# Patient Record
Sex: Female | Born: 1980 | Race: Black or African American | Hispanic: No | Marital: Single | Smoking: Current every day smoker
Health system: Southern US, Community
[De-identification: ages and names within clinical notes are randomized; demographics above are authoritative.]

## PROBLEM LIST (undated history)

## (undated) DIAGNOSIS — M1990 Unspecified osteoarthritis, unspecified site: Secondary | ICD-10-CM

## (undated) DIAGNOSIS — B019 Varicella without complication: Secondary | ICD-10-CM

## (undated) DIAGNOSIS — IMO0001 Reserved for inherently not codable concepts without codable children: Secondary | ICD-10-CM

## (undated) HISTORY — DX: Unspecified osteoarthritis, unspecified site: M19.90

## (undated) HISTORY — DX: Varicella without complication: B01.9

## (undated) HISTORY — PX: ROBOTIC ASSISTED LAPAROSCOPIC VAGINAL HYSTERECTOMY WITH FIBROID REMOVAL: SHX5387

---

## 2007-05-11 LAB — HM PAP SMEAR

## 2012-06-01 ENCOUNTER — Encounter: Payer: Self-pay | Admitting: Family Medicine

## 2012-06-01 ENCOUNTER — Ambulatory Visit (INDEPENDENT_AMBULATORY_CARE_PROVIDER_SITE_OTHER): Payer: BC Managed Care – PPO | Admitting: Family Medicine

## 2012-06-01 VITALS — BP 110/70 | HR 105 | Temp 98.5°F | Ht 65.0 in | Wt 180.0 lb

## 2012-06-01 DIAGNOSIS — G5603 Carpal tunnel syndrome, bilateral upper limbs: Secondary | ICD-10-CM

## 2012-06-01 DIAGNOSIS — R209 Unspecified disturbances of skin sensation: Secondary | ICD-10-CM

## 2012-06-01 DIAGNOSIS — Z13220 Encounter for screening for lipoid disorders: Secondary | ICD-10-CM

## 2012-06-01 DIAGNOSIS — Z0000 Encounter for general adult medical examination without abnormal findings: Secondary | ICD-10-CM

## 2012-06-01 DIAGNOSIS — Z131 Encounter for screening for diabetes mellitus: Secondary | ICD-10-CM

## 2012-06-01 DIAGNOSIS — R202 Paresthesia of skin: Secondary | ICD-10-CM

## 2012-06-01 DIAGNOSIS — G5600 Carpal tunnel syndrome, unspecified upper limb: Secondary | ICD-10-CM

## 2012-06-01 LAB — LIPID PANEL
HDL: 59.1 mg/dL (ref >39.00)
LDL Cholesterol: 121 mg/dL — ABNORMAL HIGH (ref 0–99)
Total CHOL/HDL Ratio: 3
Triglycerides: 47 mg/dL (ref 0.0–149.0)
VLDL: 9.4 mg/dL (ref 0.0–40.0)

## 2012-06-01 LAB — HEMOGLOBIN A1C: Hgb A1c MFr Bld: 5.5 % (ref 4.6–6.5)

## 2012-06-01 NOTE — Progress Notes (Signed)
Chief Complaint  Patient presents with  . Establish Care    HPI: Abigail Cherry is here to establish care. Recently moved back to area form [place].  Has not had pap smear in 5 years, female partner.  Concerns today: Tingling sensation in bilat hands: -technician for quadriceps, does a lot of work with hands -thumb and first finger, tingling -worse at night -no weakness or numbness  Other Providers: none  Flu vaccine: -tdap in 2010 when enrolling for college -refused flu vaccine  ROS: See pertinent positives and negatives per HPI.  Past Medical History  Diagnosis Date  . Chicken pox     Family History  Problem Relation Age of Onset  . Alcohol abuse      parent  . Arthritis      parent  . Hypertension      parent  . Mental illness      parent  . Mental illness Other   . Diabetes      parent  . Diabetes Other   . Hypertension Father   . Diabetes Father   . Diabetes Sister     History   Social History  . Marital Status: Single    Spouse Name: N/A    Number of Children: N/A  . Years of Education: N/A   Social History Main Topics  . Smoking status: Current Every Day Smoker -- 0.5 packs/day    Types: Cigarettes  . Smokeless tobacco: None  . Alcohol Use: Yes     4 beers per week  . Drug Use: No  . Sexually Active: Yes     female partner   Other Topics Concern  . None   Social History Narrative  . None    No current outpatient prescriptions on file.  EXAM:  Filed Vitals:   06/01/12 1107  BP: 110/70  Pulse: 105  Temp: 98.5 F (36.9 C)    Body mass index is 29.95 kg/(m^2).  GENERAL: vitals reviewed and listed above, alert, oriented, appears well hydrated and in no acute distress  HEENT: atraumatic, conjunttiva clear, no obvious abnormalities on inspection of external nose and ears  NECK: no obvious masses on inspection  LUNGS: clear to auscultation bilaterally, no wheezes, rales or rhonchi, good air movement  CV: HRRR, no peripheral  edema  MS: moves all extremities without noticeable abnormality  PSYCH: pleasant and cooperative, no obvious depression or anxiety  ASSESSMENT AND PLAN:  Discussed the following assessment and plan:  1. Carpal tunnel syndrome on both sides    2. Encounter for preventive health examination  Lipid Panel, Hemoglobin A1c  3. Paresthesias  Vitamin D, 25-hydroxy, Vitamin B12  4. Screening for diabetes mellitus    5. Screening for hypercholesterolemia     -We reviewed the PMH, PSH, FH, SH, Meds and Allergies. -We addressed current concerns per orders and patient instructions. -USPSTF preventive care recommendations discussed - advised on safe alcohol use, safe sexual practices, diet and exercise, cervical cancer screening, vaccines, diabetes and hyperlipidemia screening -We have asked for records for pertinent exams, studies, vaccines and notes from previous providers. -We have advised patient to follow up per instructions below. -Influenza vaccine refused  -Patient advised to return or notify a doctor immediately if symptoms worsen or persist or new concerns arise.  Patient Instructions  --We have ordered labs or studies at this visit. It usually takes 1-2 weeks for results and processing. We will contact you with instructions IF your results are abnormal. Normal results will be  released to your [facility] in 1-2 weeks. If you have not heard from [place] or can not find your results in [facility] in 2 weeks please contact our office.  -buy cockup wrist brace at drugstore and wear nightly and as tolerated during the day  -do stretching exercises demonstrated multiple times throughout the day  -follow up 2 months for pap smear and follow up (schedule 30 minute appointment)  Thank you for enrolling in MyChart. Please follow the instructions below to securely access your online medical record. MyChart allows you to send messages to your doctor, view your test results, renew your prescriptions, schedule  appointments, and more.  How Do I Sign Up? 1. In your Internet browser, go to [email or web address] 2. Click on the New  User? link in the Sign In box.  3. Enter your MyChart Access Code exactly as it appears below. You will not need to use this code after you have completed the sign-up process. If you do not sign up before the expiration date, you must request a new code. MyChart Access Code: [email or web address] Expires: 07/01/2012 11:35 AM  4. Enter the last four digits of your Social Security Number (xxxx) and Date of Birth (mm/dd/yyyy) as indicated and click Next. You will be taken to the next sign-up page. 5. Create a MyChart ID. This will be your MyChart login ID and cannot be changed, so think of one that is secure and easy to remember. 6. Create a MyChart password. You can change your password at any time. 7. Enter your Password Reset Question and Answer and click Next. This can be used at a later time if you forget your password.  8. Select your communication preference, and if applicable enter your e-mail address. You will receive e-mail notification when new information is available in MyChart by choosing to receive e-mail notifications and filling in your e-mail. 9. Click Sign In. You can now view your medical record.   Additional Information If you have questions, you can email [email or web address] or call [phone] to talk to our MyChart staff. Remember, MyChart is NOT to be used for urgent needs. For medical emergencies, dial 911.       Kriste Basque R.

## 2012-06-01 NOTE — Patient Instructions (Signed)
--  We have ordered labs or studies at this visit. It usually takes 1-2 weeks for results and processing. We will contact you with instructions IF your results are abnormal. Normal results will be released to your [facility] in 1-2 weeks. If you have not heard from [place] or can not find your results in [facility] in 2 weeks please contact our office.  -buy cockup wrist brace at drugstore and wear nightly and as tolerated during the day  -do stretching exercises demonstrated multiple times throughout the day  -follow up 2 months for pap smear and follow up (schedule 30 minute appointment)  Thank you for enrolling in MyChart. Please follow the instructions below to securely access your online medical record. MyChart allows you to send messages to your doctor, view your test results, renew your prescriptions, schedule appointments, and more.  How Do I Sign Up? 1. In your Internet browser, go to [email or web address] 2. Click on the New  User? link in the Sign In box.  3. Enter your MyChart Access Code exactly as it appears below. You will not need to use this code after you have completed the sign-up process. If you do not sign up before the expiration date, you must request a new code. MyChart Access Code: [email or web address] Expires: 07/01/2012 11:35 AM  4. Enter the last four digits of your Social Security Number (xxxx) and Date of Birth (mm/dd/yyyy) as indicated and click Next. You will be taken to the next sign-up page. 5. Create a MyChart ID. This will be your MyChart login ID and cannot be changed, so think of one that is secure and easy to remember. 6. Create a MyChart password. You can change your password at any time. 7. Enter your Password Reset Question and Answer and click Next. This can be used at a later time if you forget your password.  8. Select your communication preference, and if applicable enter your e-mail address. You will receive e-mail notification when new information  is available in MyChart by choosing to receive e-mail notifications and filling in your e-mail. 9. Click Sign In. You can now view your medical record.   Additional Information If you have questions, you can email [email or web address]  [email or web address] or call [phone] to talk to our MyChart staff. Remember, MyChart is NOT to be used for urgent needs. For medical emergencies, dial 911.

## 2012-06-03 ENCOUNTER — Telehealth: Payer: Self-pay | Admitting: Family Medicine

## 2012-06-03 NOTE — Telephone Encounter (Signed)
Please let her know, We saw she hasn't singed up for [facility], so wanted to let her know about labs.  Cholesterol a little high - recommend heathy diet and regular exercise. Vitamin D low. Advise 2000 IU Vitamin D3 OTC daily

## 2012-06-03 NOTE — Telephone Encounter (Signed)
Left a message for return call.  

## 2012-06-06 NOTE — Telephone Encounter (Signed)
Called and spoke with pt and pt is aware.  

## 2013-03-08 ENCOUNTER — Emergency Department (HOSPITAL_COMMUNITY)
Admission: EM | Admit: 2013-03-08 | Discharge: 2013-03-08 | Disposition: A | Discharge status: Home / Self Care | Payer: BC Managed Care – PPO | Source: Home / Self Care

## 2013-03-08 ENCOUNTER — Encounter (HOSPITAL_COMMUNITY): Payer: Self-pay | Admitting: Emergency Medicine

## 2013-03-08 DIAGNOSIS — J069 Acute upper respiratory infection, unspecified: Secondary | ICD-10-CM

## 2013-03-08 MED ORDER — FEXOFENADINE HCL 180 MG PO TABS: 180.0000 mg | ORAL_TABLET | Freq: Every day | ORAL | Status: DC

## 2013-03-08 MED ORDER — AZELASTINE HCL 0.15 % NA SOLN: 2.0000 | Freq: Two times a day (BID) | NASAL | Status: DC

## 2013-03-08 NOTE — ED Provider Notes (Signed)
  CSN: [id number]     Arrival date & time 03/08/13  1633 History     None    Chief Complaint  Patient presents with  . Sore Throat   (Consider location/radiation/quality/duration/timing/severity/associated sxs/prior Treatment) Patient is a 32 y.o. female presenting with pharyngitis. The history is provided by the patient.  Sore Throat This is a new problem. The current episode started more than 1 week ago. The problem has not changed since onset.Associated symptoms comments: Sx improve during day, worse in am.. The symptoms are aggravated by swallowing.    Past Medical History  Diagnosis Date  . Chicken pox    History reviewed. No pertinent past surgical history. Family History  Problem Relation Age of Onset  . Alcohol abuse      parent  . Arthritis      parent  . Hypertension      parent  . Mental illness      parent  . Mental illness Other   . Diabetes      parent  . Diabetes Other   . Hypertension Father   . Diabetes Father   . Diabetes Sister    History  Substance Use Topics  . Smoking status: Current Every Day Smoker -- 0.50 packs/day    Types: Cigarettes  . Smokeless tobacco: Not on file  . Alcohol Use: Yes     Comment: 4 beers per week   OB History   Grav Para Term Preterm Abortions TAB SAB Ect Mult Living                 Review of Systems  Constitutional: Negative.  Negative for fever.  HENT: Positive for congestion, sore throat, rhinorrhea and postnasal drip.     Allergies  Review of patient's allergies indicates no known allergies.  Home Medications   Current Outpatient Rx  Name  Route  Sig  Dispense  Refill  . OVER THE COUNTER MEDICATION      Cough drops, throat spray, tea         . Azelastine HCl 0.15 % SOLN   Nasal   Place 2 sprays into the nose 2 (two) times daily.   30 mL   1   . fexofenadine (ALLEGRA) 180 MG tablet   Oral   Take 1 tablet (180 mg total) by mouth daily.   30 tablet   1    BP 125/81  Pulse 90  Temp(Src)  98.2 F (36.8 C) (Oral)  Resp 18  SpO2 100%  LMP 02/16/2013 Physical Exam  Nursing note and vitals reviewed. Constitutional: She appears well-developed and well-nourished.  HENT:  Head: Normocephalic.  Right Ear: External ear normal.  Left Ear: External ear normal.  Nose: Mucosal edema and rhinorrhea present.  Mouth/Throat: Oropharynx is clear and moist.  Eyes: Pupils are equal, round, and reactive to light.  Neck: Normal range of motion. Neck supple.  Cardiovascular: Normal rate and regular rhythm.   Pulmonary/Chest: Effort normal and breath sounds normal.  Lymphadenopathy:    She has no cervical adenopathy.    ED Course   Procedures (including critical care time)  Labs Reviewed - No data to display No results found. 1. URI (upper respiratory infection)     MDM    Linna Hoff, MD 03/08/13 [phone]

## 2013-03-08 NOTE — ED Notes (Signed)
Reports intermittent sore throat, right ear pain.  Minimal cough.  Denies fever

## 2013-08-07 ENCOUNTER — Ambulatory Visit (INDEPENDENT_AMBULATORY_CARE_PROVIDER_SITE_OTHER): Payer: BC Managed Care – PPO | Admitting: Family Medicine

## 2013-08-07 ENCOUNTER — Encounter: Payer: Self-pay | Admitting: Family Medicine

## 2013-08-07 VITALS — BP 102/70 | Temp 98.6°F | Wt 199.0 lb

## 2013-08-07 DIAGNOSIS — G43909 Migraine, unspecified, not intractable, without status migrainosus: Secondary | ICD-10-CM

## 2013-08-07 DIAGNOSIS — Z0000 Encounter for general adult medical examination without abnormal findings: Secondary | ICD-10-CM

## 2013-08-07 DIAGNOSIS — Z299 Encounter for prophylactic measures, unspecified: Secondary | ICD-10-CM

## 2013-08-07 DIAGNOSIS — J309 Allergic rhinitis, unspecified: Secondary | ICD-10-CM

## 2013-08-07 DIAGNOSIS — E785 Hyperlipidemia, unspecified: Secondary | ICD-10-CM

## 2013-08-07 DIAGNOSIS — E559 Vitamin D deficiency, unspecified: Secondary | ICD-10-CM

## 2013-08-07 LAB — LIPID PANEL
HDL: 44.8 mg/dL (ref >39.00)
LDL Cholesterol: 104 mg/dL — ABNORMAL HIGH (ref 0–99)
Total CHOL/HDL Ratio: 4
VLDL: 12.4 mg/dL (ref 0.0–40.0)

## 2013-08-07 MED ORDER — FLUTICASONE PROPIONATE 50 MCG/ACT NA SUSP: 2.0000 | Freq: Every day | NASAL | Status: DC

## 2013-08-07 MED ORDER — SUMATRIPTAN SUCCINATE 50 MG PO TABS: ORAL_TABLET | ORAL | Status: DC

## 2013-08-07 NOTE — Patient Instructions (Signed)
-  We have ordered labs or studies at this visit. It can take up to 1-2 weeks for results and processing. We will contact you with instructions IF your results are abnormal. Normal results will be released to your [facility]. If you have not heard from [place] or can not find your results in [facility] in 2 weeks please contact our office.  -PLEASE SIGN UP FOR MYCHART TODAY   We recommend the following healthy lifestyle measures: - eat a healthy diet consisting of lots of vegetables, fruits, beans, nuts, seeds, healthy meats such as white chicken and fish and whole grains.  - avoid fried foods, fast food, processed foods, sodas, red meet and other fattening foods.  - get a least 150 minutes of aerobic exercise per week.   -As we discussed, we have prescribed a new medication for you at this appointment. We discussed the common and serious potential adverse effects of this medication and you can review these and more with the pharmacist when you pick up your medication.  Please follow the instructions for use carefully and notify us immediately if you have any problems taking this medication.  -see eye doctor  -1000 IU of vit D3 daily  -flonase 2 sprays daily for one month then 1 spray daily  Follow up in: 1-2 months for pap smear and headaches

## 2013-08-07 NOTE — Progress Notes (Signed)
Chief Complaint  Patient presents with  . Annual Exam    HPI:  Here for CPE:  -Concerns today:   Headaches: -for some time -occurs a few times per month, less frequent recent -on sided, usually on the R, last for several hours  -sleep and ibuprofen help -she thinks trigger may be eye strain as occur after tedious eye work -also has allergie issues, sneezing, nasal congestion -accompanied by sensitivity to light -has a hx headaches a few times per year prior -denies: fevers, vision changes, malaise, vomiting, weakness, numbness  -Diet: variety of foods, balance and well rounded, larger portion sizes  -Taking vit d, calcium: no  -Exercise: no regular exercise  -Diabetes and Dyslipidemia Screening: done  -Hx of HTN: no  -Vaccines: UTD  -pap history: reports thinks last pap was in 2010, normal, all normal paps  -FDLMP: 08/04/13, does not want to do pap today  -wants STI testing: no  -FH breast, colon or ovarian ca: see FH  -Alcohol, Tobacco, drug use: see social history  Review of Systems - negative except where noted above  Past Medical History  Diagnosis Date  . Chicken pox     No past surgical history on file.  Family History  Problem Relation Age of Onset  . Alcohol abuse      parent  . Arthritis      parent  . Hypertension      parent  . Mental illness      parent  . Mental illness Other   . Diabetes      parent  . Diabetes Other   . Hypertension Father   . Diabetes Father   . Diabetes Sister     History   Social History  . Marital Status: Single    Spouse Name: N/A    Number of Children: N/A  . Years of Education: N/A   Social History Main Topics  . Smoking status: Current Every Day Smoker -- 0.50 packs/day    Types: Cigarettes  . Smokeless tobacco: None  . Alcohol Use: Yes     Comment: 4 beers per week  . Drug Use: No  . Sexual Activity: Yes     Comment: female partner   Other Topics Concern  . None   Social History  Narrative  . None    Current outpatient prescriptions:Azelastine HCl 0.15 % SOLN, Place 2 sprays into the nose 2 (two) times daily., Disp: 30 mL, Rfl: 1;  fexofenadine (ALLEGRA) 180 MG tablet, Take 1 tablet (180 mg total) by mouth daily., Disp: 30 tablet, Rfl: 1;  fluticasone (FLONASE) 50 MCG/ACT nasal spray, Place 2 sprays into both nostrils daily., Disp: 16 g, Rfl: 6;  OVER THE COUNTER MEDICATION, Cough drops, throat spray, tea, Disp: , Rfl:  SUMAtriptan (IMITREX) 50 MG tablet, One tablet at onset of headache, may repeat in 2 hours. No more then 2 tablets in 24 hours., Disp: 10 tablet, Rfl: 0  EXAM:  Filed Vitals:   08/07/13 0930  BP: 102/70  Temp: 98.6 F (37 C)    GENERAL: vitals reviewed and listed below, alert, oriented, appears well hydrated and in no acute distress  HEENT: head atraumatic, PERRLA, normal appearance of eyes, ears, nose and mouth. moist mucus membranes except boggy turbinates and clear rhinorrhea with PND  NECK: supple, no masses or lymphadenopathy  LUNGS: clear to auscultation bilaterally, no rales, rhonchi or wheeze  CV: HRRR, no peripheral edema or cyanosis, normal pedal pulses  BREAST: deferred  GU: deferred  RECTAL: refused  SKIN: no rash or abnormal lesions  MS: normal gait, moves all extremities normally  NEURO: CN II-XII grossly intact, normal muscle strength and sensation to light touch on extremities, finger to nose normal  PSYCH: normal affect, pleasant and cooperative  ASSESSMENT AND PLAN:  Discussed the following assessment and plan:  Migraine - Plan: SUMAtriptan (IMITREX) 50 MG tablet  Allergic rhinitis - Plan: fluticasone (FLONASE) 50 MCG/ACT nasal spray  Preventive measure  Dyslipidemia - Plan: Lipid Panel  Unspecified vitamin D deficiency - Plan: Vitamin D, 25-hydroxy  -Discussed and advised all [place] preventive services health task force level A and B recommendations for age, sex and risks.  -Advised at least 150 minutes of  exercise per week and a healthy diet low in saturated fats and sweets and consisting of fresh fruits and vegetables, lean meats such as fish and white chicken and whole grains.  -FASTING Labs today  -headaches likely migraine, triggers may be allergies or eye strain - flonase for allergies, advised she see eye doctor for eval and number provided, trial of triptan after discussion risks and benefits, discussed other possible etiologies and follow up advised  -labs, studies and vaccines per orders this encounter  Orders Placed This Encounter  Procedures  . Lipid Panel  . Vitamin D, 25-hydroxy    Patient Instructions  -We have ordered labs or studies at this visit. It can take up to 1-2 weeks for results and processing. We will contact you with instructions IF your results are abnormal. Normal results will be released to your [facility]. If you have not heard from [place] or can not find your results in [facility] in 2 weeks please contact our office.  -PLEASE SIGN UP FOR MYCHART TODAY   We recommend the following healthy lifestyle measures: - eat a healthy diet consisting of lots of vegetables, fruits, beans, nuts, seeds, healthy meats such as white chicken and fish and whole grains.  - avoid fried foods, fast food, processed foods, sodas, red meet and other fattening foods.  - get a least 150 minutes of aerobic exercise per week.   -As we discussed, we have prescribed a new medication for you at this appointment. We discussed the common and serious potential adverse effects of this medication and you can review these and more with the pharmacist when you pick up your medication.  Please follow the instructions for use carefully and notify us immediately if you have any problems taking this medication.  -see eye doctor  -1000 IU of vit D3 daily  -flonase 2 sprays daily for one month then 1 spray daily  Follow up in: 1-2 months for pap smear and headaches     Patient advised to return to  clinic immediately if symptoms worsen or persist or new concerns.   Return for HA and pap smear.  Kriste Basque R.

## 2013-08-07 NOTE — Progress Notes (Signed)
Pre visit review using our clinic review tool, if applicable. No additional management support is needed unless otherwise documented below in the visit note. 

## 2013-09-20 ENCOUNTER — Encounter: Payer: Self-pay | Admitting: Family Medicine

## 2013-09-20 ENCOUNTER — Ambulatory Visit (INDEPENDENT_AMBULATORY_CARE_PROVIDER_SITE_OTHER): Payer: BC Managed Care – PPO | Admitting: Family Medicine

## 2013-09-20 ENCOUNTER — Other Ambulatory Visit (HOSPITAL_COMMUNITY)
Admission: RE | Admit: 2013-09-20 | Discharge: 2013-09-20 | Disposition: A | Discharge status: Home / Self Care | Payer: BC Managed Care – PPO | Source: Ambulatory Visit | Attending: Family Medicine | Admitting: Family Medicine

## 2013-09-20 VITALS — BP 118/80 | Temp 98.7°F | Wt 194.0 lb

## 2013-09-20 DIAGNOSIS — Z01419 Encounter for gynecological examination (general) (routine) without abnormal findings: Secondary | ICD-10-CM | POA: Insufficient documentation

## 2013-09-20 DIAGNOSIS — R51 Headache: Secondary | ICD-10-CM

## 2013-09-20 DIAGNOSIS — Z1151 Encounter for screening for human papillomavirus (HPV): Secondary | ICD-10-CM | POA: Insufficient documentation

## 2013-09-20 DIAGNOSIS — J309 Allergic rhinitis, unspecified: Secondary | ICD-10-CM

## 2013-09-20 DIAGNOSIS — Z124 Encounter for screening for malignant neoplasm of cervix: Secondary | ICD-10-CM

## 2013-09-20 DIAGNOSIS — R519 Headache, unspecified: Secondary | ICD-10-CM | POA: Insufficient documentation

## 2013-09-20 LAB — CBC WITH DIFFERENTIAL/PLATELET
Basophils Absolute: 0 10*3/uL (ref 0.0–0.1)
Basophils Relative: 0.7 % (ref 0.0–3.0)
EOS PCT: 1.9 % (ref 0.0–5.0)
Eosinophils Absolute: 0.1 10*3/uL (ref 0.0–0.7)
HEMATOCRIT: 44.3 % (ref 36.0–46.0)
Hemoglobin: 14.5 g/dL (ref 12.0–15.0)
LYMPHS PCT: 43.3 % (ref 12.0–46.0)
Lymphs Abs: 2.2 10*3/uL (ref 0.7–4.0)
MCHC: 32.6 g/dL (ref 30.0–36.0)
MCV: 95.8 fl (ref 78.0–100.0)
MONOS PCT: 7.8 % (ref 3.0–12.0)
Monocytes Absolute: 0.4 10*3/uL (ref 0.1–1.0)
Neutro Abs: 2.3 10*3/uL (ref 1.4–7.7)
Neutrophils Relative %: 46.3 % (ref 43.0–77.0)
PLATELETS: 240 10*3/uL (ref 150.0–400.0)
RBC: 4.62 Mil/uL (ref 3.87–5.11)
RDW: 13.8 % (ref 11.5–14.6)
WBC: 5 10*3/uL (ref 4.5–10.5)

## 2013-09-20 LAB — T4, FREE: Free T4: 0.93 ng/dL (ref 0.60–1.60)

## 2013-09-20 LAB — TSH: TSH: 1.2 u[IU]/mL (ref 0.35–5.50)

## 2013-09-20 NOTE — Patient Instructions (Signed)
-  start flonase  -see eye doctor - call us to let us know how this goes  -keep headache journal  -call in two weeks if headaches persist

## 2013-09-20 NOTE — Addendum Note (Signed)
Addended by: Elmer Picker on: 09/20/2013 12:10 PM   Modules accepted: Orders

## 2013-09-20 NOTE — Progress Notes (Signed)
Chief Complaint  Patient presents with  . Gynecologic Exam    HPI:  AR: -trial of flonase  - did not try  Migraine: -started 2 months ago -trial of imitrex, seemed to help a little but not much -goes to eye doctor tomorrow - feels like this is eye strain - occurs after trying to read and squinting -aura + -almost daily, sometime R, sometimes L temporal region - more often on R  Cervical Cancer screen: -needs pap, last pap a long time ago - reports normal  ROS: See pertinent positives and negatives per HPI.  Past Medical History  Diagnosis Date  . Chicken pox     No past surgical history on file.  Family History  Problem Relation Age of Onset  . Alcohol abuse      parent  . Arthritis      parent  . Hypertension      parent  . Mental illness      parent  . Mental illness Other   . Diabetes      parent  . Diabetes Other   . Hypertension Father   . Diabetes Father   . Diabetes Sister     History   Social History  . Marital Status: Single    Spouse Name: N/A    Number of Children: N/A  . Years of Education: N/A   Social History Main Topics  . Smoking status: Current Every Day Smoker -- 0.50 packs/day    Types: Cigarettes  . Smokeless tobacco: None  . Alcohol Use: Yes     Comment: 4 beers per week  . Drug Use: No  . Sexual Activity: Yes     Comment: female partner   Other Topics Concern  . None   Social History Narrative  . None    Current outpatient prescriptions:Azelastine HCl 0.15 % SOLN, Place 2 sprays into the nose 2 (two) times daily., Disp: 30 mL, Rfl: 1;  fexofenadine (ALLEGRA) 180 MG tablet, Take 1 tablet (180 mg total) by mouth daily., Disp: 30 tablet, Rfl: 1;  fluticasone (FLONASE) 50 MCG/ACT nasal spray, Place 2 sprays into both nostrils daily., Disp: 16 g, Rfl: 6;  OVER THE COUNTER MEDICATION, Cough drops, throat spray, tea, Disp: , Rfl:  SUMAtriptan (IMITREX) 50 MG tablet, One tablet at onset of headache, may repeat in 2 hours. No more  then 2 tablets in 24 hours., Disp: 10 tablet, Rfl: 0  EXAM:  Filed Vitals:   09/20/13 1009  BP: 118/80  Temp: 98.7 F (37.1 C)    Body mass index is 32.28 kg/(m^2).  GENERAL: vitals reviewed and listed above, alert, oriented, appears well hydrated and in no acute distress  HEENT: atraumatic, PERRLA, no temp art TTP or bruit, conjunttiva clear, no obvious abnormalities on inspection of external nose and ears  NECK: no obvious masses on inspection, no meningeal signs  GU: normal, pap obtained  MS: moves all extremities without noticeable abnormality  PSYCH: pleasant and cooperative, no obvious depression or anxiety  NEURO: CN II-XII grossly intact, normal gait  ASSESSMENT AND PLAN:  Discussed the following assessment and plan:  Persistent headaches -we discussed possible serious and likely etiologies, workup and treatment, treatment risks and return precautions -after this discussion, Jaeliana opted for:  - Plan: CBC with Differential, TSH, T4, Free, Sedimentation Rate -optho eval, start flonase,  Labs, headache journal,  likely migraine, eye strain or allergy related but if persist, due to change in frequency and poor response to triptan, see  neuro -of course, we advised Quincey  to return or notify a doctor immediately if symptoms worsen or persist or new concerns arise.  Cervical cancer screening  AR (allergic rhinitis)  -Patient advised to return or notify a doctor immediately if symptoms worsen or persist or new concerns arise.  There are no Patient Instructions on file for this visit.   Colin Benton R.

## 2013-09-20 NOTE — Progress Notes (Signed)
Pre visit review using our clinic review tool, if applicable. No additional management support is needed unless otherwise documented below in the visit note. 

## 2013-09-21 ENCOUNTER — Telehealth: Payer: Self-pay | Admitting: Family Medicine

## 2013-09-21 LAB — SEDIMENTATION RATE: Sed Rate: 1 mm/hr (ref 0–22)

## 2013-09-21 NOTE — Telephone Encounter (Signed)
Relevant patient education mailed to patient.  

## 2013-10-31 ENCOUNTER — Ambulatory Visit (INDEPENDENT_AMBULATORY_CARE_PROVIDER_SITE_OTHER): Payer: BC Managed Care – PPO | Admitting: Family Medicine

## 2013-10-31 VITALS — BP 120/90 | Temp 98.4°F | Wt 198.0 lb

## 2013-10-31 DIAGNOSIS — R51 Headache: Secondary | ICD-10-CM

## 2013-10-31 DIAGNOSIS — R519 Headache, unspecified: Secondary | ICD-10-CM

## 2013-10-31 DIAGNOSIS — R300 Dysuria: Secondary | ICD-10-CM

## 2013-10-31 LAB — POCT URINALYSIS DIPSTICK
Glucose, UA: NEGATIVE
KETONES UA: NEGATIVE
Nitrite, UA: NEGATIVE
PH UA: 6.5
Spec Grav, UA: 1.025
UROBILINOGEN UA: 4

## 2013-10-31 MED ORDER — CIPROFLOXACIN HCL 250 MG PO TABS: 250.0000 mg | ORAL_TABLET | Freq: Two times a day (BID) | ORAL | Status: DC

## 2013-10-31 NOTE — Patient Instructions (Signed)
-  We have ordered labs or studies at this visit. It can take up to 1-2 weeks for results and processing. We will contact you with instructions IF your results are abnormal. Normal results will be released to your [facility]. If you have not heard from [place] or can not find your results in [facility] in 2 weeks please contact our office.  -As we discussed, we have prescribed a new medication for you at this appointment. We discussed the common and serious potential adverse effects of this medication and you can review these and more with the pharmacist when you pick up your medication.  Please follow the instructions for use carefully and notify us immediately if you have any problems taking this medication.  -get eye glasses  -follow up as needed

## 2013-10-31 NOTE — Progress Notes (Signed)
Chief Complaint  Patient presents with  . Dysuria    HPI:  Acute visit for:  1)Dysuria: -last night -burning, frequency, urgency -fevers, vomiting, flank pain, hematuria, vaginal symptoms -fdlmp: feb 28th  2)Headaches: -advised eye exam ,journal, trial of imitrex -reports: reports doing much better, had eye exam and is going to get glasses   ROS: See pertinent positives and negatives per HPI.  Past Medical History  Diagnosis Date  . Chicken pox     No past surgical history on file.  Family History  Problem Relation Age of Onset  . Alcohol abuse      parent  . Arthritis      parent  . Hypertension      parent  . Mental illness      parent  . Mental illness Other   . Diabetes      parent  . Diabetes Other   . Hypertension Father   . Diabetes Father   . Diabetes Sister     History   Social History  . Marital Status: Single    Spouse Name: N/A    Number of Children: N/A  . Years of Education: N/A   Social History Main Topics  . Smoking status: Current Every Day Smoker -- 0.50 packs/day    Types: Cigarettes  . Smokeless tobacco: Not on file  . Alcohol Use: Yes     Comment: 4 beers per week  . Drug Use: No  . Sexual Activity: Yes     Comment: female partner   Other Topics Concern  . Not on file   Social History Narrative  . No narrative on file    Current outpatient prescriptions:Azelastine HCl 0.15 % SOLN, Place 2 sprays into the nose 2 (two) times daily., Disp: 30 mL, Rfl: 1;  ciprofloxacin (CIPRO) 250 MG tablet, Take 1 tablet (250 mg total) by mouth 2 (two) times daily., Disp: 6 tablet, Rfl: 0;  fexofenadine (ALLEGRA) 180 MG tablet, Take 1 tablet (180 mg total) by mouth daily., Disp: 30 tablet, Rfl: 1 fluticasone (FLONASE) 50 MCG/ACT nasal spray, Place 2 sprays into both nostrils daily., Disp: 16 g, Rfl: 6;  OVER THE COUNTER MEDICATION, Cough drops, throat spray, tea, Disp: , Rfl: ;  SUMAtriptan (IMITREX) 50 MG tablet, One tablet at onset of  headache, may repeat in 2 hours. No more then 2 tablets in 24 hours., Disp: 10 tablet, Rfl: 0  EXAM:  Filed Vitals:   10/31/13 1112  BP: 120/90  Temp: 98.4 F (36.9 C)    Body mass index is 32.95 kg/(m^2).  GENERAL: vitals reviewed and listed above, alert, oriented, appears well hydrated and in no acute distress  HEENT: atraumatic, conjunttiva clear, no obvious abnormalities on inspection of external nose and ears  NECK: no obvious masses on inspection  LUNGS: clear to auscultation bilaterally, no wheezes, rales or rhonchi, good air movement  CV: HRRR, no peripheral edema  ABD: soft, NTTP, no CVA TTP  MS: moves all extremities without noticeable abnormality  PSYCH: pleasant and cooperative, no obvious depression or anxiety  ASSESSMENT AND PLAN:  Discussed the following assessment and plan:  Dysuria - Plan: POCT urinalysis dipstick, Culture, Urine, ciprofloxacin (CIPRO) 250 MG tablet  Persistent headaches  -udip abn, discuss options will do abx after discussion risks, near period -culture pending, symptomatic care and return precautions discussed -headaches much better, info for cheap eye glasses provided -Patient advised to return or notify a doctor immediately if symptoms worsen or persist or new concerns  arise.  Patient Instructions  -We have ordered labs or studies at this visit. It can take up to 1-2 weeks for results and processing. We will contact you with instructions IF your results are abnormal. Normal results will be released to your [facility]. If you have not heard from [place] or can not find your results in [facility] in 2 weeks please contact our office.  -As we discussed, we have prescribed a new medication for you at this appointment. We discussed the common and serious potential adverse effects of this medication and you can review these and more with the pharmacist when you pick up your medication.  Please follow the instructions for use carefully and notify us  immediately if you have any problems taking this medication.  -get eye glasses  -follow up as needed          Andris Brothers R.

## 2013-11-02 LAB — URINE CULTURE
Colony Count: NO GROWTH
Organism ID, Bacteria: NO GROWTH

## 2014-05-03 ENCOUNTER — Encounter: Payer: Self-pay | Admitting: Family Medicine

## 2014-05-03 ENCOUNTER — Ambulatory Visit (INDEPENDENT_AMBULATORY_CARE_PROVIDER_SITE_OTHER): Payer: BC Managed Care – PPO | Admitting: Family Medicine

## 2014-05-03 VITALS — BP 108/62 | HR 102 | Temp 99.3°F | Ht 65.0 in | Wt 195.0 lb

## 2014-05-03 DIAGNOSIS — N912 Amenorrhea, unspecified: Secondary | ICD-10-CM

## 2014-05-03 DIAGNOSIS — R1900 Intra-abdominal and pelvic swelling, mass and lump, unspecified site: Secondary | ICD-10-CM

## 2014-05-03 LAB — CBC WITH DIFFERENTIAL/PLATELET
Basophils Absolute: 0 10*3/uL (ref 0.0–0.1)
Basophils Relative: 0.7 % (ref 0.0–3.0)
EOS PCT: 4.5 % (ref 0.0–5.0)
Eosinophils Absolute: 0.2 10*3/uL (ref 0.0–0.7)
HEMATOCRIT: 42.5 % (ref 36.0–46.0)
Hemoglobin: 14 g/dL (ref 12.0–15.0)
Lymphocytes Relative: 33.8 % (ref 12.0–46.0)
Lymphs Abs: 1.5 10*3/uL (ref 0.7–4.0)
MCHC: 33 g/dL (ref 30.0–36.0)
MCV: 96.6 fl (ref 78.0–100.0)
MONOS PCT: 11.9 % (ref 3.0–12.0)
Monocytes Absolute: 0.5 10*3/uL (ref 0.1–1.0)
NEUTROS ABS: 2.2 10*3/uL (ref 1.4–7.7)
Neutrophils Relative %: 49.1 % (ref 43.0–77.0)
Platelets: 199 10*3/uL (ref 150.0–400.0)
RBC: 4.4 Mil/uL (ref 3.87–5.11)
RDW: 14.3 % (ref 11.5–15.5)
WBC: 4.4 10*3/uL (ref 4.0–10.5)

## 2014-05-03 LAB — POCT URINE PREGNANCY: Preg Test, Ur: NEGATIVE

## 2014-05-03 NOTE — Progress Notes (Signed)
Pre visit review using our clinic review tool, if applicable. No additional management support is needed unless otherwise documented below in the visit note. 

## 2014-05-03 NOTE — Progress Notes (Addendum)
No chief complaint on file.   HPI:  Acute visit for:  1) Abd Pain/Consitpation: -on and off for 3 months -she thinks it is related to her constipation -usually LUQ,  L mid quadrant - not really a pain - feels more like a pressure or "somthing moving" "knows it is not a baby" as she has only had female partners- "but feels like a baby" -BMs 1 time per week, straining, stools are hard bowels - occ tear with straining and streak of blood - this is rare -she has struggled with constipation her whole life -she uses enemas sometimes, sometimes uses doculax -prune juice did not work -reports her belly has seemed to enlarge over last 2 months -denies: fevers, vomiting, urinary symptoms, malaise, vomiting, hematochezia, melena, breast tenderness -no FH of GI disorders or cancers -[facility] August 18th, periods were regular before then - she denies any female partners in 66 years -she reports she drinks heavy often  ROS: See pertinent positives and negatives per HPI.  Past Medical History  Diagnosis Date  . Chicken pox     No past surgical history on file.  Family History  Problem Relation Age of Onset  . Alcohol abuse      parent  . Arthritis      parent  . Hypertension      parent  . Mental illness      parent  . Mental illness Other   . Diabetes      parent  . Diabetes Other   . Hypertension Father   . Diabetes Father   . Diabetes Sister     History   Social History  . Marital Status: Single    Spouse Name: N/A    Number of Children: N/A  . Years of Education: N/A   Social History Main Topics  . Smoking status: Current Every Day Smoker -- 0.50 packs/day    Types: Cigarettes  . Smokeless tobacco: None  . Alcohol Use: Yes     Comment: 4 beers per week  . Drug Use: No  . Sexual Activity: Yes     Comment: female partner   Other Topics Concern  . None   Social History Narrative  . None    No current outpatient prescriptions on file.  EXAM:  Filed Vitals:   05/03/14 1307  BP: 108/62  Pulse: 102  Temp: 99.3 F (37.4 C)    Body mass index is 32.45 kg/(m^2).  GENERAL: vitals reviewed and listed above, alert, oriented, appears well hydrated and in no acute distress  HEENT: atraumatic, conjunttiva clear, no obvious abnormalities on inspection of external nose and ears  NECK: no obvious masses on inspection  LUNGS: clear to auscultation bilaterally, no wheezes, rales or rhonchi, good air movement  ABD: BS hypoactive, abdomen with difuse non-tender firm irregular mass extending from RLQ to LUQ   CV: HRRR, no peripheral edema  MS: moves all extremities without noticeable abnormality  PSYCH: pleasant and cooperative, no obvious depression or anxiety  ASSESSMENT AND PLAN:  Discussed the following assessment and plan:  Amenorrhea - Plan: POCT urine pregnancy, hCG, serum, qualitative  Abdominal mass - Plan: CBC with Differential, CMP with eGFR, hCG, serum, qualitative, US Abdomen Complete  -very odd presentation and exam -we discussed possible serious and likely etiologies, concern for pregnancy, intra-abdominal mass, workup and treatment, treatment risks and return precautions -urine preg neg -proceeds with labs per above and [place], then CT scan -advised no drinking and seek care immediately if pain,  vomiting, diarrhea, bleeding, etc -mirilax fo constipation -Patient advised to return or notify a doctor immediately if symptoms worsen or persist or new concerns arise.  There are no Patient Instructions on file for this visit.   Colin Benton R.

## 2014-05-04 LAB — COMPLETE METABOLIC PANEL WITH GFR
ALT: 10 U/L (ref 0–35)
AST: 19 U/L (ref 0–37)
Albumin: 4.2 g/dL (ref 3.5–5.2)
Alkaline Phosphatase: 48 U/L (ref 39–117)
BILIRUBIN TOTAL: 0.4 mg/dL (ref 0.2–1.2)
BUN: 9 mg/dL (ref 6–23)
CO2: 27 meq/L (ref 19–32)
CREATININE: 1.02 mg/dL (ref 0.50–1.10)
Calcium: 9.2 mg/dL (ref 8.4–10.5)
Chloride: 108 mEq/L (ref 96–112)
GFR, EST AFRICAN AMERICAN: 83 mL/min
GFR, Est Non African American: 72 mL/min
GLUCOSE: 82 mg/dL (ref 70–99)
Potassium: 5.6 mEq/L — ABNORMAL HIGH (ref 3.5–5.3)
Sodium: 143 mEq/L (ref 135–145)
Total Protein: 7 g/dL (ref 6.0–8.3)

## 2014-05-04 LAB — HCG, SERUM, QUALITATIVE: Preg, Serum: NEGATIVE

## 2014-05-08 ENCOUNTER — Ambulatory Visit
Admission: RE | Admit: 2014-05-08 | Discharge: 2014-05-08 | Disposition: A | Discharge status: Home / Self Care | Payer: BC Managed Care – PPO | Source: Ambulatory Visit | Attending: Family Medicine | Admitting: Family Medicine

## 2014-05-08 ENCOUNTER — Other Ambulatory Visit: Payer: Self-pay | Admitting: *Deleted

## 2014-05-08 DIAGNOSIS — R1900 Intra-abdominal and pelvic swelling, mass and lump, unspecified site: Secondary | ICD-10-CM

## 2014-05-09 ENCOUNTER — Other Ambulatory Visit: Payer: BC Managed Care – PPO

## 2014-05-10 ENCOUNTER — Ambulatory Visit (INDEPENDENT_AMBULATORY_CARE_PROVIDER_SITE_OTHER)
Admission: RE | Admit: 2014-05-10 | Discharge: 2014-05-10 | Disposition: A | Discharge status: Home / Self Care | Payer: BC Managed Care – PPO | Source: Ambulatory Visit | Attending: Family Medicine | Admitting: Family Medicine

## 2014-05-10 DIAGNOSIS — R1900 Intra-abdominal and pelvic swelling, mass and lump, unspecified site: Secondary | ICD-10-CM

## 2014-05-10 MED ORDER — IOHEXOL 300 MG/ML  SOLN
100.0000 mL | Freq: Once | INTRAMUSCULAR | Status: AC | PRN
  Administered 2014-05-10: 100 mL via INTRAVENOUS

## 2014-05-11 ENCOUNTER — Ambulatory Visit: Payer: BC Managed Care – PPO

## 2014-05-11 ENCOUNTER — Ambulatory Visit: Payer: BC Managed Care – PPO | Attending: Gynecologic Oncology | Admitting: Gynecologic Oncology

## 2014-05-11 ENCOUNTER — Encounter: Payer: Self-pay | Admitting: Gynecologic Oncology

## 2014-05-11 VITALS — BP 127/83 | HR 104 | Temp 98.6°F | Resp 22 | Ht 65.0 in | Wt 194.2 lb

## 2014-05-11 DIAGNOSIS — R1900 Intra-abdominal and pelvic swelling, mass and lump, unspecified site: Secondary | ICD-10-CM

## 2014-05-11 DIAGNOSIS — D259 Leiomyoma of uterus, unspecified: Secondary | ICD-10-CM | POA: Insufficient documentation

## 2014-05-11 DIAGNOSIS — R1909 Other intra-abdominal and pelvic swelling, mass and lump: Secondary | ICD-10-CM | POA: Insufficient documentation

## 2014-05-11 DIAGNOSIS — F17210 Nicotine dependence, cigarettes, uncomplicated: Secondary | ICD-10-CM | POA: Diagnosis not present

## 2014-05-11 DIAGNOSIS — D250 Submucous leiomyoma of uterus: Secondary | ICD-10-CM

## 2014-05-11 LAB — LACTATE DEHYDROGENASE (CC13): LDH: 293 U/L — ABNORMAL HIGH (ref 125–245)

## 2014-05-11 NOTE — Patient Instructions (Signed)
We will call you with Lab results and appts regarding surgery at [facility].

## 2014-05-11 NOTE — Progress Notes (Signed)
Consult Note: Gyn-Onc  Consult was requested by Dr. Rodena Goldmann for the evaluation of Abigail Cherry 33 y.o. female with an enlarging pelvic mass (solid, measuring 26cm)  CC: Dr Rodena Goldmann Chief Complaint  Patient presents with  . Fibroids    pelvic mass    Assessment/Plan:  [name]  is a 33 y.o.  year old who is seen in consultation at the request of Dr Maudie Mercury for an enlarging pelvic mass. I performed a history, physical examination, and personally viewed the patient's imaging films including the CT scan of the abdomen and pelvis from 05/10/14. This mass is likely uterine (fibroid) vs ovarian in origin. It is solid. It is symptomatic (decreased bladder capacity and constipation and abdominal discomfort) and enlarging per patient, and therefore, I recommend its surgical removal.  We will draw tumor markers today which may assist in a diagnosis of an ovarian stromal or germ cell tumor.  I discussed surgery with the patient. I discussed that I recommend exploratory laparotomy, possible right salpingo-oophorectomy (if it is an ovarian mass) vs myomectomy/hysterectomy (if it is a myoma). I discussed that if frozen section reveals malignancy (either uterine or ovarian), we would recommend TAH, BSO and staging (as deemed appropriate by the pathologic findings).  We discussed the pro's and con's to myomectomy vs hysterectomy. I discussed that, if this mass is a myoma, myomectomy would preserve fertility (though this would result in increased pregnancy risks and risk for uterine rupture and need for cesarean section) however myomectomy is associated with greater surgical EBL, longer surgical recovery and risk of recurrence of future myomas.  Alternatively hysterectomy would result in permanent infertility, however, may be associated with less blood loss and certainly, if the cervix was removed, would prevent recurrent myomas.  The patient is very unclear about whether or not she would prefer  hysterectomy vs myomectomy if this is a myoma. She has a same sex partner, and would like to have a biologic family, and is uncertain if her partner would be able to carry a child. She is planning to discuss this decision further with her partner and will let me know her final decision regarding myomectomy vs hysterectomy in the event that this mass is a myoma. Obviously if this mass is a benign ovarian mass, unilateral salpingo-oophorectmy would be performed and fertility could be preserved.  I discussed surgical risks with Abigail Cherry including  bleeding, infection, damage to internal organs (such as bladder,ureters, bowels), blood clot, reoperation and rehospitalization. I discussed that this surgery could be performed here in [place] in mid November, or sooner in [place] at her preference. She is electing to have surgery sooner in [facility], and I will make this arrangement with one of my partners.  HPI: Abigail Cherry is a 65 year old woman (G0) who has a several month history of an enlarging pelvic/abdominal mass, constipation and urinary frequency/decreased bladder capacity. She has begun to feel symptoms of abdominal discomfort. She sought evaluation with Dr Maudie Mercury her primary care physician who performed an examination which identified an abdomino-pelvic mass and ordered CT and US imaging.  The CT of the abdomen and pelvis on 05/10/14 showed a uterus with an irregular contour with multiple masses arising from the uterus eccentrically. There is diffuse endometrial thickening (this scan was performed during the patient's menstrual period). There is a mass extending from the right lower to mid pelvis into the upper pelvis and abdomen. It crosses the midline and measures 18.2x22.2x9.7cm. It is heterogeneously solid  with patchy enhancement.  There was no ascites, lymphadenopathy, omental disease or upper abdominal disease. It is unclear if the mass originates from the uterus or right ovary.  The US of the  pelvis on 05/08/14 showed a midline mass extending from the umbilicus to the lower pelvic area measuring 25.6x9.7x16.3cm. The [place] favored it being a myoma.  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  Interval History: She continues to have normal regular (5 day, light) menses with no intermenstrual bleeding or pain. She has no history of abnormal paps and a pap smear in February 2015 was normal. She denies a change in hair distribution on her face and abdomen (though she reports she has always been somewhat hirsuit).  Current Meds:  No outpatient encounter prescriptions on file as of 05/11/2014.    Allergy: No Known Allergies  Social Hx:   History   Social History  . Marital Status: Single    Spouse Name: N/A    Number of Children: N/A  . Years of Education: N/A   Occupational History  . Not on file.   Social History Main Topics  . Smoking status: Current Every Day Smoker -- 0.50 packs/day    Types: Cigarettes  . Smokeless tobacco: Not on file  . Alcohol Use: Yes     Comment: 4 beers per week  . Drug Use: No  . Sexual Activity: Yes     Comment: female partner   Other Topics Concern  . Not on file   Social History Narrative  . No narrative on file    Past Surgical Hx: History reviewed. No pertinent past surgical history.  Past Medical Hx:  Past Medical History  Diagnosis Date  . Chicken pox     Past Gynecological History:  LMP 05/04/14. G0. Engages in only same sex relationships.  Patient's last menstrual period was 04/30/2014.  Family Hx:  Family History  Problem Relation Age of Onset  . Alcohol abuse      parent  . Arthritis      parent  . Hypertension      parent  . Mental illness      parent  . Mental illness Other   . Diabetes      parent  . Diabetes Other   . Hypertension Father   . Diabetes Father   . Diabetes Sister     Review of  Systems:  Constitutional  Feels well,    ENT Normal appearing ears and nares bilaterally Skin/Breast  No rash, sores, jaundice, itching, dryness Cardiovascular  No chest pain, shortness of breath, or edema  Pulmonary  No cough or wheeze.  Gastro Intestinal  No nausea, vomitting, or diarrhoea. No bright red blood per rectum, no abdominal pain, change in bowel movement, or constipation.  Genito Urinary  + frequency, No urgency or dysuria, see HPI Musculo Skeletal  No myalgia, arthralgia, joint swelling or pain  Neurologic  No weakness, numbness, change in gait,  Psychology  No depression, anxiety, insomnia.   Vitals:  Blood pressure 127/83, pulse 104, temperature 98.6 F (37 C), temperature source Oral, resp. rate 22, height 5\' 5"  (1.651 m), weight 194 lb 3.2 oz (88.089 kg), last menstrual period 04/30/2014.  Physical Exam: WD in NAD Neck  Supple NROM, without any enlargements.  Lymph Node Survey No cervical supraclavicular or inguinal adenopathy Cardiovascular  Pulse normal rate, regularity and rhythm. S1  and S2 normal.  Lungs  Clear to auscultation bilateraly, without wheezes/crackles/rhonchi. Good air movement.  Skin  No rash/lesions/breakdown  Psychiatry  Alert and oriented to person, place, and time  Abdomen  Normoactive bowel sounds, abdomen soft, non-tender and mildly overweight without evidence of hernia. There is a large minimally mobile mass appreciated in the lower abdomen extending from the right pelvis towards the left upper abdomen. It is firm and nontender. Back No CVA tenderness Genito Urinary  Vulva/vagina: Normal external female genitalia.  No lesions. No discharge or bleeding. Increased pubic hair distribution on medial thighs.  Bladder/urethra:  No lesions or masses, well supported bladder  Vagina: normal in appearance  Cervix: Normal appearing, no lesions.  Uterus: Difficult to discretely appreciate due to large pelvic mass, however the uterus  itself to me feels separate from the mass and is moderately globular and mobile.  Adnexa: Large pelvic mass filling pelvis and extending to the upper abdomen. Smooth and regular to palpate.  Rectal  Good tone, no masses no cul de sac nodularity.  Extremities  No bilateral cyanosis, clubbing or edema.   Donaciano Eva, MD   05/11/2014, 3:16 PM

## 2014-05-12 LAB — CA 125(PREVIOUS METHOD): CA 125: 10.2 U/mL (ref 0.0–30.2)

## 2014-05-14 ENCOUNTER — Telehealth: Payer: Self-pay | Admitting: *Deleted

## 2014-05-14 LAB — CA 125: CA 125: 17 U/mL (ref <35)

## 2014-05-14 LAB — INHIBIN A: INHIBIN-A: 4 pg/mL

## 2014-05-14 LAB — AFP TUMOR MARKER: AFP TUMOR MARKER: 1.9 ng/mL (ref <6.1)

## 2014-05-14 LAB — AFP TUMOR MARKER-PREVIOUS METHOD: AFP Tumor Marker: 1.6 ng/mL (ref 0.0–8.0)

## 2014-05-14 NOTE — Telephone Encounter (Signed)
Called pt discussed surgery options- pt states she would rather have surgery in November at [facility].Gave pt f/u MD appt with Dr. Skeet Latch Nov 12. Surgery Nov 17. Pt confirmed

## 2014-05-25 ENCOUNTER — Encounter: Payer: Self-pay | Admitting: Gynecologic Oncology

## 2014-05-30 ENCOUNTER — Encounter: Payer: Self-pay | Admitting: Gynecologic Oncology

## 2014-05-30 ENCOUNTER — Encounter: Payer: Self-pay | Admitting: Family Medicine

## 2014-05-30 NOTE — Telephone Encounter (Signed)
Called patient. She has many questions about whether or not she needs to have surgery after reading about fibroids online. She also is having difficulty deciding on myomectomy vs hysterectomy.I advised that Dr. Denman George or Dr. Skeet Latch would be best equipped to help her with these questions and encouraged her to contact them asap. I did advise surgery is likely needed to determine diagnosis, treatment and to remove mass to decrease complications from mass effect given size and rapid growth. We briefly reviewed that if mass is myoma, myomectomy could preserve fertility, but pregnancy would be higher risk and surgical risks and recurrence may be greater with myomectomy vs hysterectomy. Hysterectomy would cause permanent infertility. I advised her to contact me at any point if difficulty contacting her specialists or further questions.

## 2014-06-11 ENCOUNTER — Telehealth: Payer: Self-pay | Admitting: *Deleted

## 2014-06-11 NOTE — Telephone Encounter (Signed)
Called pt, appt on thurday Nov 5 with Dr. Skeet Latch at 3:15pm.  Offered pt earlier time to see MD.  Pt declined.  Pt confirmed appt 06/14/14 at 3:15pm

## 2014-06-14 ENCOUNTER — Encounter: Payer: Self-pay | Admitting: Gynecologic Oncology

## 2014-06-14 ENCOUNTER — Ambulatory Visit: Payer: BC Managed Care – PPO | Attending: Gynecologic Oncology | Admitting: Gynecologic Oncology

## 2014-06-14 DIAGNOSIS — R1900 Intra-abdominal and pelvic swelling, mass and lump, unspecified site: Secondary | ICD-10-CM | POA: Insufficient documentation

## 2014-06-14 DIAGNOSIS — K5900 Constipation, unspecified: Secondary | ICD-10-CM | POA: Insufficient documentation

## 2014-06-14 DIAGNOSIS — F17210 Nicotine dependence, cigarettes, uncomplicated: Secondary | ICD-10-CM | POA: Insufficient documentation

## 2014-06-14 NOTE — Progress Notes (Signed)
Consult Note: Gyn-Onc  Consult was requested by Dr. Rodena Goldmann for the evaluation of Abigail Cherry 33 y.o. female with an enlarging pelvic mass (solid, measuring 26cm)  CC: Dr Rodena Goldmann Pelvic mass  Assessment/Plan:  Abigail Cherry  is a 33 y.o.  year old who is seen in consultation at the request of Dr Maudie Mercury for an enlarging pelvic mass. I performed a history, physical examination, and personally viewed the patient's imaging films including the CT scan of the abdomen and pelvis from 05/10/14. This mass is likely uterine (fibroid) vs ovarian in origin. It is solid. It is symptomatic (decreased bladder capacity and constipation and abdominal discomfort) and enlarging per patient, and therefore, I recommend its surgical removal.  I discussed surgery with the patient. I discussed that I recommend exploratory laparotomy, possible right salpingo-oophorectomy (if it is an ovarian mass) vs myomectomy/hysterectomy (if it is a myoma). I discussed that if frozen section reveals malignancy (either uterine or ovarian), we would recommend TAH, BSO and staging (as deemed appropriate by the pathologic findings).  if this mass is a myoma, myomectomy would preserve fertility (though this would result in increased pregnancy risks and risk for uterine rupture and need for cesarean section) however myomectomy is associated with greater surgical EBL, longer surgical recovery and risk of recurrence of future myomas.  Alternatively hysterectomy would result in permanent infertility, however, may be associated with less blood loss and certainly, if the cervix was removed, would prevent recurrent myomas.  The patient wishes preservation of her fertility if it is possible/feasible.   The planned procedure isexploratory laparotomy, possible right salpingo-oophorectomy (if it is an ovarian mass) vs myomectomy/hysterectomy (if it is a myoma). possible staging.    I discussed surgical risks with Abigail Cherry including  bleeding,  infection, damage to internal organs (such as bladder,ureters, bowels), blood clot, reoperation and rehospitalization.  The procedure will be performed 06/26/2014.   Al of her questions and those of her partner were answered to their satisfaction.   HPI: Abigail Cherry is a 33 year old woman (G0) who has a several month history of an enlarging pelvic/abdominal mass, constipation and urinary frequency/decreased bladder capacity. She has begun to feel symptoms of abdominal discomfort. She sought evaluation with Dr Maudie Mercury her primary care physician who performed an examination which identified an abdomino-pelvic mass and ordered CT and US imaging.  The CT of the abdomen and pelvis on 05/10/14 showed a uterus with an irregular contour with multiple masses arising from the uterus eccentrically. There is diffuse endometrial thickening (this scan was performed during the patient's menstrual period). There is a mass extending from the right lower to mid pelvis into the upper pelvis and abdomen. It crosses the midline and measures 18.2x22.2x9.7cm. It is heterogeneously solid with patchy enhancement.  There was no ascites, lymphadenopathy, omental disease or upper abdominal disease. It is unclear if the mass originates from the uterus or right ovary.  The US of the pelvis on 05/08/14 showed a midline mass extending from the umbilicus to the lower pelvic area measuring 25.6x9.7x16.3cm. The [place] favored it being a myoma.  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  Interval History: She continues to have normal regular (5 day, light) menses with no intermenstrual bleeding or pain. She has no history of abnormal paps and a pap smear in February 2015 was normal. She denies a change in hair distribution  on her face and abdomen (though she reports she has always been somewhat hirsuit and regularly shaves).  CA 125 19 AFP wnl Hgb  14.5  Current Meds:  No outpatient encounter prescriptions on file as of 06/14/2014.    Allergy: No Known Allergies  Social Hx:   History   Social History  . Marital Status: Single    Spouse Name: N/A    Number of Children: N/A  . Years of Education: N/A   Occupational History  . Not on file.   Social History Main Topics  . Smoking status: Current Every Day Smoker -- 0.50 packs/day for 20 years    Types: Cigarettes  . Smokeless tobacco: Not on file  . Alcohol Use: Yes     Comment: 4 beers per week  . Drug Use: No  . Sexual Activity: Yes     Comment: female partner   Other Topics Concern  . Not on file   Social History Narrative    Past Surgical Hx: History reviewed. No pertinent past surgical history.  Past Medical Hx:  Past Medical History  Diagnosis Date  . Chicken pox     Past Gynecological History:  LMP 05/04/14. G0. Engages in only same sex relationships.  No LMP recorded.  Family Hx:  Family History  Problem Relation Age of Onset  . Alcohol abuse      parent  . Arthritis      parent  . Hypertension      parent  . Mental illness      parent  . Mental illness Other   . Diabetes      parent  . Diabetes Other   . Hypertension Father   . Diabetes Father   . Diabetes Sister     Review of Systems:  Constitutional  Feels well,    ENT Normal appearing ears and nares bilaterally Cardiovascular  No chest pain,  Mild shortness of breath, no lower extremity edema  Pulmonary  No cough or wheeze. Mild SOB Gastro Intestinal  No nausea, vomitting, or diarrhoea. No bright red blood per rectum, no abdominal pain, change in bowel movement, or constipation.  Genito Urinary  + frequency, No urgency or dysuria,  Musculo Skeletal  No myalgia, arthralgia, joint swelling or pain  Neurologic  No weakness, numbness, change in gait,  Psychology  No depression, anxiety, insomnia.    Physical Exam:  WD in NAD Neck  Supple NROM, without any enlargements.   Lymph Node Survey No cervical supraclavicular or inguinal adenopathy Cardiovascular  Pulse normal rate, regularity and rhythm. S1 and S2 normal.  Lungs  Clear to auscultation bilateraly, without wheezes/crackles/rhonchi. Good air movement.  Skin  No rash/lesions/breakdown  Psychiatry  Alert and oriented to person, place, and time, teary about the possibility of losing her uterus.  Abdomen  Normoactive bowel sounds, abdomen soft, non-tender and mildly overweight without evidence of hernia. There is a large minimally mobile mass appreciated in the lower abdomen extending from the right pelvis towards the left upper abdomen. It is firm and nontender. Back No CVA tenderness Genito Urinary  Vulva/vagina: Normal external female genitalia.  No lesions. No discharge or bleeding. Increased pubic hair distribution on medial thighs.  Bladder/urethra:  No lesions or masses, well supported bladder  Vagina: normal in appearance  Cervix: Normal appearing, no lesions.  Uterus: Difficult to discretely appreciate due to large pelvic mass, however the uterus itself to me feels separate from the mass and is moderately globular and mobile.  Adnexa:  Large pelvic mass filling pelvis and extending to the upper abdomen. Smooth and regular to palpate.  Extremities  No bilateral cyanosis, clubbing or edema.   Janie Morning, MD   06/14/2014, 3:53 PM

## 2014-06-14 NOTE — Patient Instructions (Signed)
Preparing for your Surgery  Pre-operative Testing -You will receive a phone call from presurgical testing at [facility] to arrange for a pre-operative testing appointment before your surgery.  This appointment normally occurs one to two weeks before your scheduled surgery.   -Bring your insurance card, copy of an advanced directive if applicable, medication list  -At that visit, you will be asked to sign a consent for a possible blood transfusion in case a transfusion becomes necessary during surgery.  The need for a blood transfusion is rare but having consent is a necessary part of your care.     Day Before Surgery at [facility] will be asked to take in only clear liquids the dabased off of your provider's recommendations.  You will be advised to have nothing to eat or drink after midnight the evening before.   y before surgery.  Examples of clear liquids include broths, jello, and clear juices.  You are to perform a Fleets enema bowel prep the night before your surgery.  You will be advised to have nothing to eat or drink after midnight the evening before.    Your role in recovery Your role is to become active as soon as directed by your doctor, while still giving yourself time to heal.  Rest when you feel tired. You will be asked to do the following in order to speed your recovery:  - Cough and breathe deeply. This helps toclear and expand your lungs and can prevent pneumonia. You may be given a spirometer to practice deep breathing. A staff member will show you how to use the spirometer. - Do mild physical activity. Walking or moving your legs help your circulation and body functions return to normal. A staff member will help you when you try to walk and will provide you with simple exercises. Do not try to get up or walk alone the first time. - Actively manage your pain. Managing your pain lets you move in comfort. We will ask you to rate your pain on a scale of zero to 10. It is  your responsibility to tell your doctor or nurse where and how much you hurt so your pain can be treated.  Special Considerations -If you are diabetic, you may be placed on insulin after surgery to have closer control over your blood sugars to promote healing and recovery.  This does not mean that you will be discharged on insulin.  If applicable, your oral antidiabetics will be resumed when you are tolerating a solid diet.  -Your final pathology results from surgery should be available by the Friday after surgery and the results will be relayed to you when available.  Pelvic Mass A "mass" is a lump that either your caregiver found during an examination or you found before seeing your caregiver. The "pelvis" is the lower portion of the trunk in between the hip bones. There are many possible reasons why a lump has appeared. Testing will help determine the cause and the steps to a solution. CAUSES  Before complete testing is done, it may be difficult or impossible for your caregiver to know if the lump is truly in one of the pelvic organs (such as the uterus or ovaries) or is coming from one of many organs that are near the pelvis. Problems in the colon or kidney can also lead to a lump that might seem to be in the pelvis. If testing shows that the mass is in the pelvis, there are still many  possible causes:  Tumors and cancers. These problems are relatively common and are the greatest source of worry for patients. Cancerous lumps in the pelvis may be due to cancers that started in the uterus or ovaries or due to cancers that started in other areas and then spread to the pelvis. Many cancers are very treatable when found early.  Non-cancerous tumors and masses. There are a large number of common and uncommon non-cancerous problems that can lead to a mass in the pelvis. Two very common ones are fibroids of the uterus and ovarian cysts. Before testing and/or surgery, it may be impossible to tell the  difference between these problems and a cancer.  Infection. Certain types of infections can produce a mass in the pelvis. The infection might be caused by bacteria. If there is an infection treatment might include antibiotics. Masses from infection can also be caused by certain viruses, and in rare cases, by fungi or parasites. If infection is the cause, your caregiver will be able to determine the type of germ responsible for the mass by doing appropriate testing.  Inflammatory bowel disease. These are diseases thought to be caused by a defect in the immune system of the intestine. There are two inflammatory bowel diseases: Ulcerative Colitis and Crohn's Disease. They are lifelong problems with symptoms that can come and go. Sometimes, patients with these diseases will develop a mass in the lower part of the colon that can make it seem as though there is a mass in the pelvis.  Past Surgery. If there has been pelvic surgery in the past, and there is a lot of scarring that forms during the process of healing, this can eventually fell like a mass when examined by your caregiver. As with the other problems described above, this may or may not be associated with symptoms or feeling badly.  Ectopic Pregnancy. This is a condition where the growing fetus is growing outside of the uterus. This is a common cause for a pelvic mass and may become a serious or life-threatening problem that requires immediate surgery. SYMPTOMS  In people with a pelvic mass, there may be a large variety of associated symptoms including:   No symptoms, other than the appearance of the mass itself.  Cramping, nausea, diarrhea.  Fever, vomiting, weakness.  Pelvic, side, and/or back pain.  Weight loss.  Constipation.  Problems with vaginal bleeding. This can be very variable. Bleeding might be very light or very heavy. Bleeding may be mixed with large clots. Menstruation may be very frequent and may seem to almost completely  stop. There may be varying levels of pain with menstruation.  Urinary symptoms including frequent urination, inability to empty the bladder completely, or urinating very small amounts. DIAGNOSIS  Because of the large number of causes of a mass in the pelvis, your caregiver will ask you to undergo testing in order to get a clear diagnosis in a timely manner. The tests might include some or all of the following:  Blood tests such as a blood count, measurement of common minerals in the blood, kidney/liver/pancreas function, pregnancy test, and others.  X-rays. Plain x-rays and special x-rays may be requested except if you are pregnant.  Ultrasound. This is a test that uses sound waves to "paint a picture" of the mass. The type of "sound picture" that is seen can help to narrow the diagnosis.  CAT scan and MRI imaging. Each can provide additional information as to the different characteristics of the mass and can  help to develop a final plan for diagnostics and treatment. If cancer is suspected, these special tests can also help to show any spread of the cancer to other parts of the body. It is possible that these tests may not be ordered if you are pregnant.  Laparoscopy. This is a special exam of the inside of the pelvic area using a slim, flexible, lighted tube. This allows your caregiver to get a direct look at the mass. Sometimes, this allows getting a very small piece of the mass (a biopsy). This piece of tissue can then be examined in a lab that will frequently lead to a clear diagnosis. In some cases, your caregiver can use a laparoscope to completely remove the mass after it has been examined.  Surgery. Sometimes, a diagnosis can only be made by carrying out an operation and obtaining a biopsy (as noted above). Many times, the biopsy is obtained and the mass is removed during the same operation. These are the most common ways for determining the exact cause of the mass. Your caregiver may  recommend other tests that are not listed here. TREATMENT  Treatment(s) can only be recommended after a diagnosis is made. Your caregiver will discuss your test results with you, the meanings of the tests, and the recommended steps to begin treatment. He/she will also recommend whether you need to be examined by specialists as you go through the steps of diagnosis and a treatment plan is developed. HOME CARE INSTRUCTIONS   Test preparation. Carefully follow instructions when preparing for certain tests. This may involve many things such as:  Drinking fluids to fill the bladder before a pelvic ultrasound.  Fasting before certain blood tests.  Drinking special "contrast" fluids that are necessary for obtaining the best CAT scan and MRI images.  Medications. Your caregiver may prescribe medications to help relieve symptoms while you undergo testing. It is important that your current medications (prescription, non-prescription, herbal, vitamins, etc.) be kept in mind when new prescriptions are recommended.  Diet. There may be a need for changes in diet in order to help with symptom relief while testing is being done. If this applies to you, your caregiver will discuss these changes with you. SEEK MEDICAL CARE IF:   You cannot hold down any of the recommended fluids used to prepare for tests such as CAT scan MRI.  You feel that you are having trouble with any new prescriptions.  You develop new symptoms of pain, vomiting, diarrhea, fever, or other problems that you did not feel since your last exam.  You experience inability to empty your bladder completely or develop painful and/or bloody urination. SEEK IMMEDIATE MEDICAL CARE IF:   You vomit bright red blood, or a coffee ground appearing material.  You have blood in your stools, or the stools turn black and tarry.  You have an abnormal or increased amount of vaginal bleeding.  You have a fever.  You develop easy bruising or  bleeding.  You develop pain that is not controlled by your medication.  You feel worsening weakness or you have a fainting episode.  You feel that the mass has suddenly gotten larger.  You develop severe bloating in the abdomen and/or pelvis.  You cannot pass any urine. MAKE SURE YOU:   Understand these instructions.  Will watch your condition.  Will get help right away if you are not doing well or get worse. Document Released: 11/03/2006 Document Revised: 10/19/2011 Document Reviewed: 07/12/2007 [facility] Patient Information 2015 [employer], [place].  This information is not intended to replace advice given to you by your health care provider. Make sure you discuss any questions you have with your health care provider.

## 2014-06-15 ENCOUNTER — Telehealth: Payer: Self-pay | Admitting: *Deleted

## 2014-06-15 NOTE — Telephone Encounter (Signed)
FMLA paperwork was sent to Dr Maudie Mercury to complete and she requested the pts gynecologist fill this out.  I faxed the forms to Dr Janie Morning at [phone] requesting their office complete these.

## 2014-06-19 ENCOUNTER — Telehealth: Payer: Self-pay | Admitting: *Deleted

## 2014-06-19 NOTE — Telephone Encounter (Signed)
Pt called regarding upcoming surgery.   Pt states " I just wanted to let Dr. Skeet Latch know that I would like to keep my ovaries if possible, I understand if she has to take everything to get the mass out then that's okay too."   Reconfirmed with pt, she would like to keep ovaries if possible, otherwise she understands the uterus, ovaries and tubes may be removed due to pelvic mass.  Pt verbalized understanding, discussed time out of work may be up to 6-8 weeks depending on the surgery and recovery.  Surgery is scheduled for Nov 17 with Dr. Skeet Latch, time out of work approximately 6weeks ending on Dec 30. Pt verbalized understanding. Message forward to MD for review.

## 2014-06-19 NOTE — Telephone Encounter (Signed)
[employer] faxed FMLA forms to be completed with a note stating this is the second request for these to be completed.  I called the pt and advised her I faxed these forms to Dr Robinette Haines office on 11/6 and she stated she has talked with someone at [facility] regarding the forms.

## 2014-06-21 ENCOUNTER — Ambulatory Visit: Payer: BC Managed Care – PPO | Admitting: Gynecologic Oncology

## 2014-06-21 NOTE — Patient Instructions (Addendum)
Abigail Cherry  06/21/2014   Your procedure is scheduled on:  06/26/2014    Come thru the [place] Entrance   Follow the Signs to [place] at   [place]     am  Call this number if you have problems the morning of surgery: [phone]   Remember: Clear liquid diet starting on Monday, 06/26/2015     Do not eat food or drink liquids after midnight.   Take these medicines the morning of surgery with A SIP OF WATER: Ocean nasal spray if needed    Do not wear jewelry, make-up or nail polish.  Do not wear lotions, powders, or perfumes.  deodorant.  Do not shave 48 hours prior to surgery.   Do not bring valuables to the hospital.  Contacts, dentures or bridgework may not be worn into surgery.  Leave suitcase in the car. After surgery it may be brought to your room.  For patients admitted to the hospital, checkout time is 11:00 AM the day of  discharge. .     Please read over the following fact sheets that you were given: , coughing and deep breathing exercises, leg exercises               CLEAR LIQUID DIET   Foods Allowed                                                                     Foods Excluded  Coffee and tea, regular and decaf                             liquids that you cannot  Plain Jell-O in any flavor                                             see through such as: Fruit ices (not with fruit pulp)                                     milk, soups, orange juice  Iced Popsicles                                    All solid food Carbonated beverages, regular and diet                                    Cranberry, grape and apple juices Sports drinks like Gatorade Lightly seasoned clear broth or consume(fat free) Sugar, honey syrup  Sample Menu Breakfast                                Lunch  Supper Cranberry juice                    Beef broth                            Chicken broth Jell-O                                      Grape juice                           Apple juice Coffee or tea                        Jell-O                                      Popsicle                                                Coffee or tea                        Coffee or tea  _____________________________________________________________________  [facility] - Preparing for Surgery Before surgery, you can play an important role.  Because skin is not sterile, your skin needs to be as free of germs as possible.  You can reduce the number of germs on your skin by washing with CHG (chlorahexidine gluconate) soap before surgery.  CHG is an antiseptic cleaner which kills germs and bonds with the skin to continue killing germs even after washing. Please DO NOT use if you have an allergy to CHG or antibacterial soaps.  If your skin becomes reddened/irritated stop using the CHG and inform your nurse when you arrive at Short Stay. Do not shave (including legs and underarms) for at least 48 hours prior to the first CHG shower.  You may shave your face/neck. Please follow these instructions carefully:  1.  Shower with CHG Soap the night before surgery and the  morning of Surgery.  2.  If you choose to wash your hair, wash your hair first as usual with your  normal  shampoo.  3.  After you shampoo, rinse your hair and body thoroughly to remove the  shampoo.                           4.  Use CHG as you would any other liquid soap.  You can apply chg directly  to the skin and wash                       Gently with a scrungie or clean washcloth.  5.  Apply the CHG Soap to your body ONLY FROM THE NECK DOWN.   Do not use on face/ open                           Wound or open sores. Avoid contact with eyes, ears mouth and genitals (private parts).  Wash face,  Genitals (private parts) with your normal soap.             6.  Wash thoroughly, paying special attention to the area where your surgery  will be performed.  7.  Thoroughly  rinse your body with warm water from the neck down.  8.  DO NOT shower/wash with your normal soap after using and rinsing off  the CHG Soap.                9.  Pat yourself dry with a clean towel.            10.  Wear clean pajamas.            11.  Place clean sheets on your bed the night of your first shower and do not  sleep with pets. Day of Surgery : Do not apply any lotions/deodorants the morning of surgery.  Please wear clean clothes to the hospital/surgery center.  FAILURE TO FOLLOW THESE INSTRUCTIONS MAY RESULT IN THE CANCELLATION OF YOUR SURGERY PATIENT SIGNATURE_________________________________  NURSE SIGNATURE__________________________________  ________________________________________________________________________  WHAT IS A BLOOD TRANSFUSION? Blood Transfusion Information  A transfusion is the replacement of blood or some of its parts. Blood is made up of multiple cells which provide different functions.  Red blood cells carry oxygen and are used for blood loss replacement.  White blood cells fight against infection.  Platelets control bleeding.  Plasma helps clot blood.  Other blood products are available for specialized needs, such as hemophilia or other clotting disorders. BEFORE THE TRANSFUSION  Who gives blood for transfusions?   Healthy volunteers who are fully evaluated to make sure their blood is safe. This is blood bank blood. Transfusion therapy is the safest it has ever been in the practice of medicine. Before blood is taken from a donor, a complete history is taken to make sure that person has no history of diseases nor engages in risky social behavior (examples are intravenous drug use or sexual activity with multiple partners). The donor's travel history is screened to minimize risk of transmitting infections, such as malaria. The donated blood is tested for signs of infectious diseases, such as HIV and hepatitis. The blood is then tested to be sure it is  compatible with you in order to minimize the chance of a transfusion reaction. If you or a relative donates blood, this is often done in anticipation of surgery and is not appropriate for emergency situations. It takes many days to process the donated blood. RISKS AND COMPLICATIONS Although transfusion therapy is very safe and saves many lives, the main dangers of transfusion include:  1. Getting an infectious disease. 2. Developing a transfusion reaction. This is an allergic reaction to something in the blood you were given. Every precaution is taken to prevent this. The decision to have a blood transfusion has been considered carefully by your caregiver before blood is given. Blood is not given unless the benefits outweigh the risks. AFTER THE TRANSFUSION  Right after receiving a blood transfusion, you will usually feel much better and more energetic. This is especially true if your red blood cells have gotten low (anemic). The transfusion raises the level of the red blood cells which carry oxygen, and this usually causes an energy increase.  The nurse administering the transfusion will monitor you carefully for complications. HOME CARE INSTRUCTIONS  No special instructions are needed after a transfusion. You may find your energy is better. Speak with your caregiver about any  limitations on activity for underlying diseases you may have. SEEK MEDICAL CARE IF:   Your condition is not improving after your transfusion.  You develop redness or irritation at the intravenous (IV) site. SEEK IMMEDIATE MEDICAL CARE IF:  Any of the following symptoms occur over the next 12 hours:  Shaking chills.  You have a temperature by mouth above 102 F (38.9 C), not controlled by medicine.  Chest, back, or muscle pain.  People around you feel you are not acting correctly or are confused.  Shortness of breath or difficulty breathing.  Dizziness and fainting.  You get a rash or develop hives.  You  have a decrease in urine output.  Your urine turns a dark color or changes to pink, red, or brown. Any of the following symptoms occur over the next 10 days:  You have a temperature by mouth above 102 F (38.9 C), not controlled by medicine.  Shortness of breath.  Weakness after normal activity.  The white part of the eye turns yellow (jaundice).  You have a decrease in the amount of urine or are urinating less often.  Your urine turns a dark color or changes to pink, red, or brown. Document Released: 07/24/2000 Document Revised: 10/19/2011 Document Reviewed: 03/12/2008 ExitCare Patient Information 2014 [place].  _______________________________________________________________________  Incentive Spirometer  An incentive spirometer is a tool that can help keep your lungs clear and active. This tool measures how well you are filling your lungs with each breath. Taking long deep breaths may help reverse or decrease the chance of developing breathing (pulmonary) problems (especially infection) following:  A long period of time when you are unable to move or be active. BEFORE THE PROCEDURE   If the spirometer includes an indicator to show your best effort, your nurse or respiratory therapist will set it to a desired goal.  If possible, sit up straight or lean slightly forward. Try not to slouch.  Hold the incentive spirometer in an upright position. INSTRUCTIONS FOR USE  3. Sit on the edge of your bed if possible, or sit up as far as you can in bed or on a chair. 4. Hold the incentive spirometer in an upright position. 5. Breathe out normally. 6. Place the mouthpiece in your mouth and seal your lips tightly around it. 7. Breathe in slowly and as deeply as possible, raising the piston or the ball toward the top of the column. 8. Hold your breath for 3-5 seconds or for as long as possible. Allow the piston or ball to fall to the bottom of the column. 9. Remove the mouthpiece  from your mouth and breathe out normally. 10. Rest for a few seconds and repeat Steps 1 through 7 at least 10 times every 1-2 hours when you are awake. Take your time and take a few normal breaths between deep breaths. 11. The spirometer may include an indicator to show your best effort. Use the indicator as a goal to work toward during each repetition. 12. After each set of 10 deep breaths, practice coughing to be sure your lungs are clear. If you have an incision (the cut made at the time of surgery), support your incision when coughing by placing a pillow or rolled up towels firmly against it. Once you are able to get out of bed, walk around indoors and cough well. You may stop using the incentive spirometer when instructed by your caregiver.  RISKS AND COMPLICATIONS  Take your time so you do not get dizzy  or light-headed.  If you are in pain, you may need to take or ask for pain medication before doing incentive spirometry. It is harder to take a deep breath if you are having pain. AFTER USE  Rest and breathe slowly and easily.  It can be helpful to keep track of a log of your progress. Your caregiver can provide you with a simple table to help with this. If you are using the spirometer at home, follow these instructions: [place] IF:   You are having difficultly using the spirometer.  You have trouble using the spirometer as often as instructed.  Your pain medication is not giving enough relief while using the spirometer.  You develop fever of 100.5 F (38.1 C) or higher. SEEK IMMEDIATE MEDICAL CARE IF:   You cough up bloody sputum that had not been present before.  You develop fever of 102 F (38.9 C) or greater.  You develop worsening pain at or near the incision site. MAKE SURE YOU:   Understand these instructions.  Will watch your condition.  Will get help right away if you are not doing well or get worse. Document Released: 12/07/2006 Document Revised:  10/19/2011 Document Reviewed: 02/07/2007 [facility] Patient Information 2014 [place], [place].   ________________________________________________________________________

## 2014-06-22 ENCOUNTER — Ambulatory Visit (HOSPITAL_COMMUNITY)
Admission: RE | Admit: 2014-06-22 | Discharge: 2014-06-22 | Disposition: A | Discharge status: Home / Self Care | Payer: BC Managed Care – PPO | Source: Ambulatory Visit | Attending: Anesthesiology | Admitting: Anesthesiology

## 2014-06-22 ENCOUNTER — Encounter (HOSPITAL_COMMUNITY): Payer: Self-pay

## 2014-06-22 ENCOUNTER — Telehealth: Payer: Self-pay | Admitting: Gynecologic Oncology

## 2014-06-22 ENCOUNTER — Encounter (HOSPITAL_COMMUNITY)
Admission: RE | Admit: 2014-06-22 | Discharge: 2014-06-22 | Disposition: A | Discharge status: Home / Self Care | Payer: BC Managed Care – PPO | Source: Ambulatory Visit | Attending: Gynecologic Oncology | Admitting: Gynecologic Oncology

## 2014-06-22 DIAGNOSIS — R0602 Shortness of breath: Secondary | ICD-10-CM

## 2014-06-22 DIAGNOSIS — Z01818 Encounter for other preprocedural examination: Secondary | ICD-10-CM | POA: Diagnosis present

## 2014-06-22 DIAGNOSIS — R1900 Intra-abdominal and pelvic swelling, mass and lump, unspecified site: Secondary | ICD-10-CM | POA: Insufficient documentation

## 2014-06-22 DIAGNOSIS — F17210 Nicotine dependence, cigarettes, uncomplicated: Secondary | ICD-10-CM | POA: Diagnosis not present

## 2014-06-22 HISTORY — DX: Reserved for inherently not codable concepts without codable children: IMO0001

## 2014-06-22 LAB — COMPREHENSIVE METABOLIC PANEL
ALBUMIN: 3.9 g/dL (ref 3.5–5.2)
ALK PHOS: 58 U/L (ref 39–117)
ALT: 14 U/L (ref 0–35)
AST: 25 U/L (ref 0–37)
Anion gap: 12 (ref 5–15)
BUN: 14 mg/dL (ref 6–23)
CALCIUM: 9.7 mg/dL (ref 8.4–10.5)
CO2: 25 mEq/L (ref 19–32)
Chloride: 104 mEq/L (ref 96–112)
Creatinine, Ser: 1.17 mg/dL — ABNORMAL HIGH (ref 0.50–1.10)
GFR calc Af Amer: 69 mL/min — ABNORMAL LOW (ref >90)
GFR calc non Af Amer: 60 mL/min — ABNORMAL LOW (ref >90)
Glucose, Bld: 86 mg/dL (ref 70–99)
POTASSIUM: 4.6 meq/L (ref 3.7–5.3)
SODIUM: 141 meq/L (ref 137–147)
Total Bilirubin: 0.3 mg/dL (ref 0.3–1.2)
Total Protein: 7.9 g/dL (ref 6.0–8.3)

## 2014-06-22 LAB — CBC WITH DIFFERENTIAL/PLATELET
BASOS PCT: 1 % (ref 0–1)
Basophils Absolute: 0 10*3/uL (ref 0.0–0.1)
EOS ABS: 0.1 10*3/uL (ref 0.0–0.7)
EOS PCT: 2 % (ref 0–5)
HCT: 41.8 % (ref 36.0–46.0)
Hemoglobin: 14.2 g/dL (ref 12.0–15.0)
Lymphocytes Relative: 37 % (ref 12–46)
Lymphs Abs: 1.7 10*3/uL (ref 0.7–4.0)
MCH: 31.5 pg (ref 26.0–34.0)
MCHC: 34 g/dL (ref 30.0–36.0)
MCV: 92.7 fL (ref 78.0–100.0)
Monocytes Absolute: 0.6 10*3/uL (ref 0.1–1.0)
Monocytes Relative: 13 % — ABNORMAL HIGH (ref 3–12)
NEUTROS PCT: 47 % (ref 43–77)
Neutro Abs: 2.3 10*3/uL (ref 1.7–7.7)
PLATELETS: 240 10*3/uL (ref 150–400)
RBC: 4.51 MIL/uL (ref 3.87–5.11)
RDW: 13.7 % (ref 11.5–15.5)
WBC: 4.7 10*3/uL (ref 4.0–10.5)

## 2014-06-22 LAB — URINALYSIS, ROUTINE W REFLEX MICROSCOPIC
Bilirubin Urine: NEGATIVE
Glucose, UA: NEGATIVE mg/dL
Hgb urine dipstick: NEGATIVE
Ketones, ur: NEGATIVE mg/dL
Leukocytes, UA: NEGATIVE
NITRITE: NEGATIVE
PROTEIN: NEGATIVE mg/dL
SPECIFIC GRAVITY, URINE: 1.017 (ref 1.005–1.030)
UROBILINOGEN UA: 0.2 mg/dL (ref 0.0–1.0)
pH: 7 (ref 5.0–8.0)

## 2014-06-22 LAB — PREGNANCY, URINE: Preg Test, Ur: NEGATIVE

## 2014-06-22 LAB — ABO/RH: ABO/RH(D): A POS

## 2014-06-22 NOTE — Telephone Encounter (Signed)
Patient notified of lab results.  Advised to increase fluid intake.  Advised to call for any questions or concerns.

## 2014-06-22 NOTE — Progress Notes (Signed)
Patient reports some shortness of breath during medical history at time of preop appointment.  2V CXR ordered.   Patient also reports leg cramping in right lower calf over the last week .  " Comes and goes".  Called and spoke with Joylene John, NP with OB/GYN Oncology.  Regarding above.  Joylene John stated for patient to come to OB/GYN office after CXR complete.  Made patient aware and she voiced understanding.

## 2014-06-26 ENCOUNTER — Encounter (HOSPITAL_COMMUNITY)
Admission: RE | Disposition: A | Discharge status: Home / Self Care | Payer: Self-pay | Source: Ambulatory Visit | Attending: Obstetrics & Gynecology

## 2014-06-26 ENCOUNTER — Ambulatory Visit (HOSPITAL_COMMUNITY): Payer: BC Managed Care – PPO | Admitting: Certified Registered Nurse Anesthetist

## 2014-06-26 ENCOUNTER — Inpatient Hospital Stay (HOSPITAL_COMMUNITY)
Admission: RE | Admit: 2014-06-26 | Discharge: 2014-06-28 | DRG: 743 | Disposition: A | Discharge status: Home / Self Care | Payer: BC Managed Care – PPO | Source: Ambulatory Visit | Attending: Obstetrics & Gynecology | Admitting: Obstetrics & Gynecology

## 2014-06-26 ENCOUNTER — Encounter (HOSPITAL_COMMUNITY): Payer: Self-pay | Admitting: *Deleted

## 2014-06-26 DIAGNOSIS — D259 Leiomyoma of uterus, unspecified: Secondary | ICD-10-CM

## 2014-06-26 DIAGNOSIS — R1900 Intra-abdominal and pelvic swelling, mass and lump, unspecified site: Secondary | ICD-10-CM

## 2014-06-26 DIAGNOSIS — F17210 Nicotine dependence, cigarettes, uncomplicated: Secondary | ICD-10-CM | POA: Diagnosis present

## 2014-06-26 HISTORY — PX: MYOMECTOMY: SHX85

## 2014-06-26 HISTORY — PX: ABDOMINAL HYSTERECTOMY: SHX81

## 2014-06-26 LAB — TYPE AND SCREEN
ABO/RH(D): A POS
Antibody Screen: NEGATIVE

## 2014-06-26 SURGERY — HYSTERECTOMY, ABDOMINAL
Anesthesia: General

## 2014-06-26 MED ORDER — CEFAZOLIN SODIUM-DEXTROSE 2-3 GM-% IV SOLR
2.0000 g | INTRAVENOUS | Status: AC
  Administered 2014-06-26: 2 g via INTRAVENOUS

## 2014-06-26 MED ORDER — METOCLOPRAMIDE HCL 5 MG/ML IJ SOLN
INTRAMUSCULAR | Status: DC | PRN
  Administered 2014-06-26: 5 mg via INTRAVENOUS

## 2014-06-26 MED ORDER — ONDANSETRON HCL 4 MG/2ML IJ SOLN
INTRAMUSCULAR | Status: DC | PRN
  Administered 2014-06-26: 4 mg via INTRAVENOUS

## 2014-06-26 MED ORDER — NEOSTIGMINE METHYLSULFATE 10 MG/10ML IV SOLN
INTRAVENOUS | Status: AC
  Filled 2014-06-26: qty 1

## 2014-06-26 MED ORDER — STERILE WATER FOR IRRIGATION IR SOLN
Status: DC | PRN
  Administered 2014-06-26: 1500 mL

## 2014-06-26 MED ORDER — CISATRACURIUM BESYLATE 20 MG/10ML IV SOLN
INTRAVENOUS | Status: AC
  Filled 2014-06-26: qty 10

## 2014-06-26 MED ORDER — SUCCINYLCHOLINE CHLORIDE 20 MG/ML IJ SOLN
INTRAMUSCULAR | Status: DC | PRN
  Administered 2014-06-26: 140 mg via INTRAVENOUS

## 2014-06-26 MED ORDER — LIDOCAINE HCL (CARDIAC) 20 MG/ML IV SOLN
INTRAVENOUS | Status: AC
  Filled 2014-06-26: qty 5

## 2014-06-26 MED ORDER — PROMETHAZINE HCL 25 MG/ML IJ SOLN: 6.2500 mg | INTRAMUSCULAR | Status: DC | PRN

## 2014-06-26 MED ORDER — KETOROLAC TROMETHAMINE 30 MG/ML IJ SOLN
30.0000 mg | Freq: Four times a day (QID) | INTRAMUSCULAR | Status: AC
  Filled 2014-06-26: qty 1

## 2014-06-26 MED ORDER — CEFAZOLIN SODIUM-DEXTROSE 2-3 GM-% IV SOLR
INTRAVENOUS | Status: AC
  Filled 2014-06-26: qty 50

## 2014-06-26 MED ORDER — GLYCOPYRROLATE 0.2 MG/ML IJ SOLN
INTRAMUSCULAR | Status: AC
  Filled 2014-06-26: qty 3

## 2014-06-26 MED ORDER — HYDROMORPHONE HCL 1 MG/ML IJ SOLN
INTRAMUSCULAR | Status: DC | PRN
  Administered 2014-06-26 (×2): 1 mg via INTRAVENOUS

## 2014-06-26 MED ORDER — VASOPRESSIN 20 UNIT/ML IV SOLN
INTRAVENOUS | Status: DC | PRN
  Administered 2014-06-26: 11:00:00 via INTRAMUSCULAR

## 2014-06-26 MED ORDER — KCL IN DEXTROSE-NACL 20-5-0.45 MEQ/L-%-% IV SOLN
INTRAVENOUS | Status: DC
  Administered 2014-06-26: 15:00:00 via INTRAVENOUS
  Administered 2014-06-27: 75 mL/h via INTRAVENOUS
  Administered 2014-06-28: 05:00:00 via INTRAVENOUS
  Filled 2014-06-26 (×5): qty 1000

## 2014-06-26 MED ORDER — MIDAZOLAM HCL 5 MG/5ML IJ SOLN
INTRAMUSCULAR | Status: DC | PRN
  Administered 2014-06-26: 2 mg via INTRAVENOUS

## 2014-06-26 MED ORDER — SODIUM CHLORIDE 0.9 % IJ SOLN
INTRAMUSCULAR | Status: AC
  Filled 2014-06-26: qty 10

## 2014-06-26 MED ORDER — CISATRACURIUM BESYLATE (PF) 10 MG/5ML IV SOLN
INTRAVENOUS | Status: DC | PRN
  Administered 2014-06-26: 2 mg via INTRAVENOUS
  Administered 2014-06-26: 8 mg via INTRAVENOUS
  Administered 2014-06-26: 2 mg via INTRAVENOUS

## 2014-06-26 MED ORDER — ACETAMINOPHEN 500 MG PO TABS
1000.0000 mg | ORAL_TABLET | Freq: Four times a day (QID) | ORAL | Status: DC
  Administered 2014-06-26 – 2014-06-28 (×6): 1000 mg via ORAL
  Filled 2014-06-26 (×11): qty 2

## 2014-06-26 MED ORDER — HYDROMORPHONE HCL 1 MG/ML IJ SOLN
0.2500 mg | INTRAMUSCULAR | Status: DC | PRN
  Administered 2014-06-26 (×2): 0.5 mg via INTRAVENOUS

## 2014-06-26 MED ORDER — NEOSTIGMINE METHYLSULFATE 10 MG/10ML IV SOLN
INTRAVENOUS | Status: DC | PRN
  Administered 2014-06-26: 3 mg via INTRAVENOUS

## 2014-06-26 MED ORDER — ONDANSETRON HCL 4 MG/2ML IJ SOLN
INTRAMUSCULAR | Status: AC
  Filled 2014-06-26: qty 2

## 2014-06-26 MED ORDER — ACETAMINOPHEN 10 MG/ML IV SOLN
1000.0000 mg | Freq: Once | INTRAVENOUS | Status: AC
  Administered 2014-06-26: 1000 mg via INTRAVENOUS
  Filled 2014-06-26: qty 100

## 2014-06-26 MED ORDER — SUFENTANIL CITRATE 50 MCG/ML IV SOLN
INTRAVENOUS | Status: DC | PRN
  Administered 2014-06-26 (×5): 10 ug via INTRAVENOUS

## 2014-06-26 MED ORDER — HYDROMORPHONE HCL 1 MG/ML IJ SOLN
INTRAMUSCULAR | Status: AC
  Filled 2014-06-26: qty 1

## 2014-06-26 MED ORDER — PROPOFOL 10 MG/ML IV BOLUS
INTRAVENOUS | Status: AC
  Filled 2014-06-26: qty 20

## 2014-06-26 MED ORDER — LIDOCAINE HCL (CARDIAC) 20 MG/ML IV SOLN
INTRAVENOUS | Status: DC | PRN
  Administered 2014-06-26: 75 mg via INTRAVENOUS

## 2014-06-26 MED ORDER — DEXAMETHASONE SODIUM PHOSPHATE 10 MG/ML IJ SOLN
INTRAMUSCULAR | Status: AC
  Filled 2014-06-26: qty 1

## 2014-06-26 MED ORDER — GLYCOPYRROLATE 0.2 MG/ML IJ SOLN
INTRAMUSCULAR | Status: DC | PRN
  Administered 2014-06-26: 0.6 mg via INTRAVENOUS

## 2014-06-26 MED ORDER — ENSURE COMPLETE PO LIQD
237.0000 mL | Freq: Two times a day (BID) | ORAL | Status: DC
  Administered 2014-06-26 – 2014-06-27 (×3): 237 mL via ORAL

## 2014-06-26 MED ORDER — KETOROLAC TROMETHAMINE 30 MG/ML IJ SOLN
30.0000 mg | Freq: Four times a day (QID) | INTRAMUSCULAR | Status: AC
  Administered 2014-06-26 – 2014-06-27 (×4): 30 mg via INTRAVENOUS
  Filled 2014-06-26 (×2): qty 1

## 2014-06-26 MED ORDER — HYDROMORPHONE HCL 1 MG/ML IJ SOLN
0.5000 mg | INTRAMUSCULAR | Status: DC | PRN
  Administered 2014-06-26 – 2014-06-27 (×3): 1 mg via INTRAVENOUS
  Filled 2014-06-26 (×3): qty 1

## 2014-06-26 MED ORDER — ONDANSETRON HCL 4 MG PO TABS: 4.0000 mg | ORAL_TABLET | Freq: Four times a day (QID) | ORAL | Status: DC | PRN

## 2014-06-26 MED ORDER — SUFENTANIL CITRATE 50 MCG/ML IV SOLN
INTRAVENOUS | Status: AC
  Filled 2014-06-26: qty 1

## 2014-06-26 MED ORDER — PROPOFOL 10 MG/ML IV BOLUS
INTRAVENOUS | Status: DC | PRN
  Administered 2014-06-26: 170 mg via INTRAVENOUS

## 2014-06-26 MED ORDER — METOCLOPRAMIDE HCL 5 MG/ML IJ SOLN
INTRAMUSCULAR | Status: AC
  Filled 2014-06-26: qty 2

## 2014-06-26 MED ORDER — SODIUM CHLORIDE 0.9 % IV SOLN
20.0000 [IU] | Freq: Once | INTRAVENOUS | Status: DC
  Filled 2014-06-26: qty 1

## 2014-06-26 MED ORDER — HYDROMORPHONE HCL 1 MG/ML IJ SOLN
0.5000 mg | Freq: Once | INTRAMUSCULAR | Status: AC
Start: 2014-06-26 — End: 2014-06-26
  Administered 2014-06-26: 0.5 mg via INTRAVENOUS

## 2014-06-26 MED ORDER — BUPIVACAINE LIPOSOME 1.3 % IJ SUSP
INTRAMUSCULAR | Status: DC | PRN
  Administered 2014-06-26: 20 mL

## 2014-06-26 MED ORDER — MIDAZOLAM HCL 2 MG/2ML IJ SOLN
INTRAMUSCULAR | Status: AC
  Filled 2014-06-26: qty 2

## 2014-06-26 MED ORDER — 0.9 % SODIUM CHLORIDE (POUR BTL) OPTIME
TOPICAL | Status: DC | PRN
  Administered 2014-06-26: 1000 mL

## 2014-06-26 MED ORDER — FENTANYL CITRATE 0.05 MG/ML IJ SOLN
INTRAMUSCULAR | Status: DC | PRN
  Administered 2014-06-26: 100 ug via INTRAVENOUS
  Administered 2014-06-26 (×3): 50 ug via INTRAVENOUS

## 2014-06-26 MED ORDER — HYDROMORPHONE HCL 1 MG/ML IJ SOLN
0.5000 mg | INTRAMUSCULAR | Status: DC | PRN
Start: 2014-06-26 — End: 2014-06-26
  Administered 2014-06-26: 0.5 mg via INTRAVENOUS
  Filled 2014-06-26 (×2): qty 1

## 2014-06-26 MED ORDER — KETOROLAC TROMETHAMINE 30 MG/ML IJ SOLN
INTRAMUSCULAR | Status: AC
  Filled 2014-06-26: qty 1

## 2014-06-26 MED ORDER — ONDANSETRON HCL 4 MG/2ML IJ SOLN: 4.0000 mg | Freq: Four times a day (QID) | INTRAMUSCULAR | Status: DC | PRN

## 2014-06-26 MED ORDER — BUPIVACAINE LIPOSOME 1.3 % IJ SUSP
20.0000 mL | Freq: Once | INTRAMUSCULAR | Status: DC
  Filled 2014-06-26: qty 20

## 2014-06-26 MED ORDER — OXYCODONE HCL 5 MG PO TABS
10.0000 mg | ORAL_TABLET | Freq: Four times a day (QID) | ORAL | Status: DC | PRN
  Administered 2014-06-26 – 2014-06-28 (×4): 10 mg via ORAL
  Filled 2014-06-26 (×4): qty 2

## 2014-06-26 MED ORDER — LACTATED RINGERS IV SOLN
INTRAVENOUS | Status: DC
  Administered 2014-06-26: 11:00:00 via INTRAVENOUS
  Administered 2014-06-26: 1000 mL via INTRAVENOUS
  Administered 2014-06-26: 13:00:00 via INTRAVENOUS

## 2014-06-26 MED ORDER — SODIUM CHLORIDE 0.9 % IJ SOLN
INTRAMUSCULAR | Status: AC
  Filled 2014-06-26: qty 20

## 2014-06-26 MED ORDER — FENTANYL CITRATE 0.05 MG/ML IJ SOLN
INTRAMUSCULAR | Status: AC
  Filled 2014-06-26: qty 5

## 2014-06-26 MED ORDER — EPHEDRINE SULFATE 50 MG/ML IJ SOLN
INTRAMUSCULAR | Status: DC | PRN
Start: 2014-06-26 — End: 2014-06-26
  Administered 2014-06-26: 10 mg via INTRAVENOUS
  Administered 2014-06-26: 5 mg via INTRAVENOUS

## 2014-06-26 MED ORDER — DEXAMETHASONE SODIUM PHOSPHATE 10 MG/ML IJ SOLN
INTRAMUSCULAR | Status: DC | PRN
  Administered 2014-06-26: 10 mg via INTRAVENOUS

## 2014-06-26 MED ORDER — FENTANYL CITRATE 0.05 MG/ML IJ SOLN: 25.0000 ug | INTRAMUSCULAR | Status: DC | PRN

## 2014-06-26 SURGICAL SUPPLY — 46 items
APL SKNCLS STERI-STRIP NONHPOA (GAUZE/BANDAGES/DRESSINGS)
APPLIER CLIP 11 MED OPEN (CLIP)
APR CLP MED 11 20 MLT OPN (CLIP)
ATTRACTOMAT 16X20 MAGNETIC DRP (DRAPES) ×2 IMPLANT
BENZOIN TINCTURE PRP APPL 2/3 (GAUZE/BANDAGES/DRESSINGS) ×1 IMPLANT
BLADE EXTENDED COATED 6.5IN (ELECTRODE) ×2 IMPLANT
CHLORAPREP W/TINT 26ML (MISCELLANEOUS) ×2 IMPLANT
CLIP APPLIE 11 MED OPEN (CLIP) IMPLANT
COVER SURGICAL LIGHT HANDLE (MISCELLANEOUS) ×2 IMPLANT
DRAPE UTILITY XL STRL (DRAPES) ×1 IMPLANT
DRAPE WARM FLUID 44X44 (DRAPE) ×2 IMPLANT
DRSG TELFA 4X14 ISLAND ADH (GAUZE/BANDAGES/DRESSINGS) ×1 IMPLANT
ELECT REM PT RETURN 9FT ADLT (ELECTROSURGICAL) ×2
ELECTRODE REM PT RTRN 9FT ADLT (ELECTROSURGICAL) ×1 IMPLANT
GAUZE SPONGE 4X4 12PLY STRL (GAUZE/BANDAGES/DRESSINGS) ×1 IMPLANT
GAUZE SPONGE 4X4 16PLY XRAY LF (GAUZE/BANDAGES/DRESSINGS) IMPLANT
GLOVE BIO SURGEON STRL SZ7.5 (GLOVE) ×2 IMPLANT
GLOVE INDICATOR 8.0 STRL GRN (GLOVE) ×2 IMPLANT
GOWN STRL REUS W/TWL XL LVL3 (GOWN DISPOSABLE) ×2 IMPLANT
KIT BASIN OR (CUSTOM PROCEDURE TRAY) ×2 IMPLANT
LIGASURE IMPACT 36 18CM CVD LR (INSTRUMENTS) IMPLANT
LIQUID BAND (GAUZE/BANDAGES/DRESSINGS) IMPLANT
NS IRRIG 1000ML POUR BTL (IV SOLUTION) ×3 IMPLANT
PACK GENERAL/GYN (CUSTOM PROCEDURE TRAY) ×2 IMPLANT
PEN SKIN MARKING BROAD (MISCELLANEOUS) ×1 IMPLANT
RETRACTOR WND ALEXIS 25 LRG (MISCELLANEOUS) ×1 IMPLANT
RTRCTR WOUND ALEXIS 25CM LRG (MISCELLANEOUS) ×2
SHEET LAVH (DRAPES) ×2 IMPLANT
SPONGE LAP 18X18 X RAY DECT (DISPOSABLE) IMPLANT
STRIP CLOSURE SKIN 1/2X4 (GAUZE/BANDAGES/DRESSINGS) ×4 IMPLANT
SUT MNCRL 0 MO-4 VIOLET 18 CR (SUTURE) IMPLANT
SUT MON AB 2-0 SH 27 (SUTURE) ×8
SUT MON AB 2-0 SH27 (SUTURE) IMPLANT
SUT MONOCRYL 0 MO 4 18  CR/8 (SUTURE) ×1
SUT PDS AB 0 CT1 36 (SUTURE) ×4 IMPLANT
SUT PDS AB 0 CTX 60 (SUTURE) ×4 IMPLANT
SUT VIC AB 0 CT1 36 (SUTURE) ×6 IMPLANT
SUT VIC AB 2-0 SH 27 (SUTURE) ×4
SUT VIC AB 2-0 SH 27X BRD (SUTURE) ×2 IMPLANT
SUT VIC AB 4-0 PS2 27 (SUTURE) ×2 IMPLANT
SUT VICRYL 0 TIES 12 18 (SUTURE) ×3 IMPLANT
SWABSTICK BENZOIN STERILE (MISCELLANEOUS) ×1 IMPLANT
TOWEL BLUE STERILE X RAY DET (MISCELLANEOUS) IMPLANT
TOWEL OR 17X26 10 PK STRL BLUE (TOWEL DISPOSABLE) ×2 IMPLANT
TOWEL OR NON WOVEN STRL DISP B (DISPOSABLE) ×2 IMPLANT
TRAY FOLEY CATH 14FRSI W/METER (CATHETERS) ×2 IMPLANT

## 2014-06-26 NOTE — Op Note (Signed)
Preoperative Diagnosis: 1. Pelvic  mass.  Postoperative Diagnosis: Multiple uterine leiomyomata  Procedure(s) Performed: 1. Exploratory laparotomy with multiple myomectomies  Surgeon: Francetta Found.  Skeet Latch, MD., PhD  Assistant Surgeon: Lahoma Crocker, M.D. Assistant:   Specimens: Uterine leiomyoma  Estimated Blood Loss: 50 mL.    Complications: None.   Operative Findings: A pedunculated 35cm posterior fundal myoma.  Two 6cm fundal subserosal myoma and a posterior lower uerine intramural 4cm myoma.  Normal ovaries and tubes, uterus 7cm,   Normal appendix.    No evidence of intra-abdominal pelvic or peritoneal metastases.   Procedure:   The patient was seen in the Holding Room. The risks, benefits, complications, treatment options, and expected outcomes were discussed with the patient.  The patient concurred with the proposed plan, giving informed consent.   The patient was  identified as Abigail Cherry  and the procedure verified as resection of pelvic mass, possible myomectomy, possible hysterectomy, BSO with possible staging. A Time Out was held and the above information confirmed upon entry to the operating room..  After induction of anesthesia, the patient was draped and prepped in the usual sterile manner.  She was prepped and draped in the normal sterile fashion in the dorsal lithotomy position in padded Allen stirrups with good attention paid to support of the lower back and lower extremities. Position was adjusted for appropriate support. A Foley catheter was placed to gravity.   A 95MW periumbilical midline vertical incision was made and carried through the subcutaneous tissue to the fascia. The fascial incision was made and extended superiorally. The rectus muscles were separated. The peritoneum was identified and entered. Peritoneal incision was extended longitudinally.  The abdominal cavity was entered sharply and without incident. A wound protector was then placed. A survey of the  abdomen and pelvis revealed the above finding.  After packing the small bowel into the upper abdomen, the base of the uterine myomas was infiltrated with vasopressin.  Clamps were placed at the base and the specimens were transected.  The base was sutured and the  over sewn with a locking suture to ensure hemostasis.  After injection of vasopressin into the myometrium above the posterior intramural fibroid, an incision was made and continued to the level of the myoma.  The mass was shelled out and a clamp placed at the base.  The base was suture ligated.  The cavity was closed with Monocryl suture with care bury the suture.  The uterus was replaced into the pelvis and observed for menostasis.  Additional sutures were required to obtain hemostasis.    The pelvis was irrigated. Saline solution was placed in the pelvis.  The fascia was reapproximated with 0 looped PDS using a total of two sutures. The subcutaneous layer was then irrigated copiously.  The subcutaneous layer was reapproximated with interrupted 2-0 Vicryl. The subcutaneous layer was then infiltrated with exparil.  The skin was reapproximated with a running 4-0 Monocryl. The patient tolerated the procedure well.   Sponge, lap and needle counts were correct x 3.    The patient had sequential compression devices for VTE prophylaxis   The patient was extubated and taken to the recovery room in stable condition.

## 2014-06-26 NOTE — Interval H&P Note (Signed)
History and Physical Interval Note:  06/26/2014 9:49 AM  Abigail Cherry  has presented today for surgery, with the diagnosis of pelvic mass  The various methods of treatment have been discussed with the patient and family. After consideration of risks, benefits and other options for treatment, the patient has consented to  Procedure(s): EXPLORATORY LAPAROTOMY POSSIBLE RIGHT SALPINGO OOPHORECTOMY/POSSIBLE MYOMECTOMY/POSSIBLE HYSTERECTOMY (Right) as a surgical intervention .  The patient's history has been reviewed, patient examined, no change in status, stable for surgery.  I have reviewed the patient's chart and labs.  Questions were answered to the patient's satisfaction.     [place], [facility]

## 2014-06-26 NOTE — Transfer of Care (Signed)
Immediate Anesthesia Transfer of Care Note  Patient: Abigail Cherry  Procedure(s) Performed: Procedure(s): EXPLORATORY LAPAROTOMY  (N/A) MYOMECTOMY (N/A)  Patient Location: PACU  Anesthesia Type:General  Level of Consciousness: awake, alert , oriented, patient cooperative and responds to stimulation  Airway & Oxygen Therapy: Patient Spontanous Breathing and Patient connected to face mask oxygen  Post-op Assessment: Report given to PACU RN, Post -op Vital signs reviewed and stable and Patient moving all extremities  Post vital signs: Reviewed and stable  Complications: No apparent anesthesia complications

## 2014-06-26 NOTE — Anesthesia Preprocedure Evaluation (Signed)
Anesthesia Evaluation  Patient identified by MRN, date of birth, ID band Patient awake    Reviewed: Allergy & Precautions, H&P , NPO status , Patient's Chart, lab work & pertinent test results  Airway Mallampati: II  TM Distance: >3 FB Neck ROM: Full    Dental no notable dental hx.    Pulmonary shortness of breath and with exertion, Current Smoker,  breath sounds clear to auscultation  Pulmonary exam normal       Cardiovascular negative cardio ROS  Rhythm:Regular Rate:Normal     Neuro/Psych  Headaches,  Neuromuscular disease negative psych ROS   GI/Hepatic negative GI ROS, Neg liver ROS,   Endo/Other  negative endocrine ROS  Renal/GU negative Renal ROS  negative genitourinary   Musculoskeletal negative musculoskeletal ROS (+)   Abdominal (+) + obese,   Peds negative pediatric ROS (+)  Hematology negative hematology ROS (+)   Anesthesia Other Findings   Reproductive/Obstetrics negative OB ROS                             Anesthesia Physical Anesthesia Plan  ASA: II  Anesthesia Plan: General   Post-op Pain Management:    Induction: Intravenous  Airway Management Planned: Oral ETT  Additional Equipment:   Intra-op Plan:   Post-operative Plan: Extubation in OR  Informed Consent: I have reviewed the patients History and Physical, chart, labs and discussed the procedure including the risks, benefits and alternatives for the proposed anesthesia with the patient or authorized representative who has indicated his/her understanding and acceptance.   Dental advisory given  Plan Discussed with: CRNA  Anesthesia Plan Comments:         Anesthesia Quick Evaluation

## 2014-06-26 NOTE — Anesthesia Procedure Notes (Signed)
Procedure Name: Intubation Date/Time: 06/26/2014 10:09 AM Performed by: Ofilia Neas Pre-anesthesia Checklist: Patient identified, Emergency Drugs available, Suction available, Patient being monitored and Timeout performed Patient Re-evaluated:Patient Re-evaluated prior to inductionOxygen Delivery Method: Circle system utilized Preoxygenation: Pre-oxygenation with 100% oxygen Intubation Type: IV induction Ventilation: Mask ventilation without difficulty Laryngoscope Size: Mac and 4 Grade View: Grade II Tube type: Oral Tube size: 7.5 mm Number of attempts: 1 Airway Equipment and Method: Stylet Placement Confirmation: ETT inserted through vocal cords under direct vision,  positive ETCO2 and breath sounds checked- equal and bilateral Tube secured with: Tape Dental Injury: Teeth and Oropharynx as per pre-operative assessment

## 2014-06-26 NOTE — Progress Notes (Signed)
Dr. Margurite Auerbach notified that patient has a head cold. She states she occ coughs up mucus but mostly has a stopped up head.  She wants to proceed with surgery today

## 2014-06-26 NOTE — H&P (View-Only) (Signed)
Consult Note: Gyn-Onc  Consult was requested by Dr. Rodena Goldmann for the evaluation of Abigail Cherry 33 y.o. female with an enlarging pelvic mass (solid, measuring 26cm)  CC: Dr Rodena Goldmann Pelvic mass  Assessment/Plan:  Abigail Cherry  is a 33 y.o.  year old who is seen in consultation at the request of Dr Maudie Mercury for an enlarging pelvic mass. I performed a history, physical examination, and personally viewed the patient's imaging films including the CT scan of the abdomen and pelvis from 05/10/14. This mass is likely uterine (fibroid) vs ovarian in origin. It is solid. It is symptomatic (decreased bladder capacity and constipation and abdominal discomfort) and enlarging per patient, and therefore, I recommend its surgical removal.  I discussed surgery with the patient. I discussed that I recommend exploratory laparotomy, possible right salpingo-oophorectomy (if it is an ovarian mass) vs myomectomy/hysterectomy (if it is a myoma). I discussed that if frozen section reveals malignancy (either uterine or ovarian), we would recommend TAH, BSO and staging (as deemed appropriate by the pathologic findings).  if this mass is a myoma, myomectomy would preserve fertility (though this would result in increased pregnancy risks and risk for uterine rupture and need for cesarean section) however myomectomy is associated with greater surgical EBL, longer surgical recovery and risk of recurrence of future myomas.  Alternatively hysterectomy would result in permanent infertility, however, may be associated with less blood loss and certainly, if the cervix was removed, would prevent recurrent myomas.  The patient wishes preservation of her fertility if it is possible/feasible.   The planned procedure isexploratory laparotomy, possible right salpingo-oophorectomy (if it is an ovarian mass) vs myomectomy/hysterectomy (if it is a myoma). possible staging.    I discussed surgical risks with Abigail Cherry including  bleeding,  infection, damage to internal organs (such as bladder,ureters, bowels), blood clot, reoperation and rehospitalization.  The procedure will be performed 06/26/2014.   Al of her questions and those of her partner were answered to their satisfaction.   HPI: Abigail Cherry is a 33 year old woman (G0) who has a several month history of an enlarging pelvic/abdominal mass, constipation and urinary frequency/decreased bladder capacity. She has begun to feel symptoms of abdominal discomfort. She sought evaluation with Dr Maudie Mercury her primary care physician who performed an examination which identified an abdomino-pelvic mass and ordered CT and US imaging.  The CT of the abdomen and pelvis on 05/10/14 showed a uterus with an irregular contour with multiple masses arising from the uterus eccentrically. There is diffuse endometrial thickening (this scan was performed during the patient's menstrual period). There is a mass extending from the right lower to mid pelvis into the upper pelvis and abdomen. It crosses the midline and measures 18.2x22.2x9.7cm. It is heterogeneously solid with patchy enhancement.  There was no ascites, lymphadenopathy, omental disease or upper abdominal disease. It is unclear if the mass originates from the uterus or right ovary.  The US of the pelvis on 05/08/14 showed a midline mass extending from the umbilicus to the lower pelvic area measuring 25.6x9.7x16.3cm. The [place] favored it being a myoma.  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  Interval History: She continues to have normal regular (5 day, light) menses with no intermenstrual bleeding or pain. She has no history of abnormal paps and a pap smear in February 2015 was normal. She denies a change in hair distribution  on her face and abdomen (though she reports she has always been somewhat hirsuit and regularly shaves).  CA 125 19 AFP wnl Hgb  14.5  Current Meds:  No outpatient encounter prescriptions on file as of 06/14/2014.    Allergy: No Known Allergies  Social Hx:   History   Social History  . Marital Status: Single    Spouse Name: N/A    Number of Children: N/A  . Years of Education: N/A   Occupational History  . Not on file.   Social History Main Topics  . Smoking status: Current Every Day Smoker -- 0.50 packs/day for 20 years    Types: Cigarettes  . Smokeless tobacco: Not on file  . Alcohol Use: Yes     Comment: 4 beers per week  . Drug Use: No  . Sexual Activity: Yes     Comment: female partner   Other Topics Concern  . Not on file   Social History Narrative    Past Surgical Hx: History reviewed. No pertinent past surgical history.  Past Medical Hx:  Past Medical History  Diagnosis Date  . Chicken pox     Past Gynecological History:  LMP 05/04/14. G0. Engages in only same sex relationships.  No LMP recorded.  Family Hx:  Family History  Problem Relation Age of Onset  . Alcohol abuse      parent  . Arthritis      parent  . Hypertension      parent  . Mental illness      parent  . Mental illness Other   . Diabetes      parent  . Diabetes Other   . Hypertension Father   . Diabetes Father   . Diabetes Sister     Review of Systems:  Constitutional  Feels well,    ENT Normal appearing ears and nares bilaterally Cardiovascular  No chest pain,  Mild shortness of breath, no lower extremity edema  Pulmonary  No cough or wheeze. Mild SOB Gastro Intestinal  No nausea, vomitting, or diarrhoea. No bright red blood per rectum, no abdominal pain, change in bowel movement, or constipation.  Genito Urinary  + frequency, No urgency or dysuria,  Musculo Skeletal  No myalgia, arthralgia, joint swelling or pain  Neurologic  No weakness, numbness, change in gait,  Psychology  No depression, anxiety, insomnia.    Physical Exam:  WD in NAD Neck  Supple NROM, without any enlargements.   Lymph Node Survey No cervical supraclavicular or inguinal adenopathy Cardiovascular  Pulse normal rate, regularity and rhythm. S1 and S2 normal.  Lungs  Clear to auscultation bilateraly, without wheezes/crackles/rhonchi. Good air movement.  Skin  No rash/lesions/breakdown  Psychiatry  Alert and oriented to person, place, and time, teary about the possibility of losing her uterus.  Abdomen  Normoactive bowel sounds, abdomen soft, non-tender and mildly overweight without evidence of hernia. There is a large minimally mobile mass appreciated in the lower abdomen extending from the right pelvis towards the left upper abdomen. It is firm and nontender. Back No CVA tenderness Genito Urinary  Vulva/vagina: Normal external female genitalia.  No lesions. No discharge or bleeding. Increased pubic hair distribution on medial thighs.  Bladder/urethra:  No lesions or masses, well supported bladder  Vagina: normal in appearance  Cervix: Normal appearing, no lesions.  Uterus: Difficult to discretely appreciate due to large pelvic mass, however the uterus itself to me feels separate from the mass and is moderately globular and mobile.  Adnexa:  Large pelvic mass filling pelvis and extending to the upper abdomen. Smooth and regular to palpate.  Extremities  No bilateral cyanosis, clubbing or edema.   Janie Morning, MD   06/14/2014, 3:53 PM

## 2014-06-26 NOTE — Anesthesia Postprocedure Evaluation (Signed)
  Anesthesia Post-op Note  Patient: Abigail Cherry  Procedure(s) Performed: Procedure(s) (LRB): EXPLORATORY LAPAROTOMY  (N/A) MYOMECTOMY (N/A)  Patient Location: PACU  Anesthesia Type: General  Level of Consciousness: awake and alert   Airway and Oxygen Therapy: Patient Spontanous Breathing  Post-op Pain: mild  Post-op Assessment: Post-op Vital signs reviewed, Patient's Cardiovascular Status Stable, Respiratory Function Stable, Patent Airway and No signs of Nausea or vomiting  Last Vitals:  Filed Vitals:   06/26/14 1630  BP: 118/80  Pulse: 84  Temp: 37.2 C  Resp: 18    Post-op Vital Signs: stable   Complications: No apparent anesthesia complications

## 2014-06-27 LAB — CBC
HEMATOCRIT: 39.3 % (ref 36.0–46.0)
Hemoglobin: 13 g/dL (ref 12.0–15.0)
MCH: 31.6 pg (ref 26.0–34.0)
MCHC: 33.1 g/dL (ref 30.0–36.0)
MCV: 95.4 fL (ref 78.0–100.0)
PLATELETS: 192 10*3/uL (ref 150–400)
RBC: 4.12 MIL/uL (ref 3.87–5.11)
RDW: 13.7 % (ref 11.5–15.5)
WBC: 12.8 10*3/uL — ABNORMAL HIGH (ref 4.0–10.5)

## 2014-06-27 LAB — BASIC METABOLIC PANEL
Anion gap: 10 (ref 5–15)
BUN: 7 mg/dL (ref 6–23)
CALCIUM: 8.9 mg/dL (ref 8.4–10.5)
CO2: 27 mEq/L (ref 19–32)
CREATININE: 0.94 mg/dL (ref 0.50–1.10)
Chloride: 100 mEq/L (ref 96–112)
GFR calc Af Amer: >90 mL/min (ref >90)
GFR calc non Af Amer: 78 mL/min — ABNORMAL LOW (ref >90)
GLUCOSE: 134 mg/dL — AB (ref 70–99)
Potassium: 4.1 mEq/L (ref 3.7–5.3)
Sodium: 137 mEq/L (ref 137–147)

## 2014-06-27 MED ORDER — OXYCODONE HCL 5 MG PO TABS
5.0000 mg | ORAL_TABLET | ORAL | Status: DC | PRN
Start: 2014-06-27 — End: 2014-07-12

## 2014-06-27 MED ORDER — DOCUSATE SODIUM 100 MG PO CAPS
100.0000 mg | ORAL_CAPSULE | Freq: Two times a day (BID) | ORAL | Status: DC | PRN
  Administered 2014-06-27: 100 mg via ORAL
  Filled 2014-06-27 (×2): qty 1

## 2014-06-27 NOTE — Discharge Instructions (Signed)
06/27/2014  Return to work: 4-6 weeks if applicable  Activity: 1. Be up and out of the bed during the day.  Take a nap if needed.  You may walk up steps but be careful and use the hand rail.  Stair climbing will tire you more than you think, you may need to stop part way and rest.   2. No lifting or straining for 6 weeks.  3. No driving for 2 week(s).  Do not drive if you are taking narcotic pain medicine.  4. Shower daily.  Use soap and water on your incision and pat dry; don't rub.  No tub baths until cleared by your surgeon.   5. No sexual activity and nothing in the vagina for 6 weeks.  Diet: 1. Low sodium Heart Healthy Diet is recommended.  2. It is safe to use a laxative, such as Miralax or Colace, if you have difficulty moving your bowels.   Wound Care: 1. Keep clean and dry.  Shower daily.  Reasons to call the Doctor:  Fever - Oral temperature greater than 100.4 degrees Fahrenheit  Foul-smelling vaginal discharge  Difficulty urinating  Nausea and vomiting  Increased pain at the site of the incision that is unrelieved with pain medicine.  Difficulty breathing with or without chest pain  New calf pain especially if only on one side  Sudden, continuing increased vaginal bleeding with or without clots.   Contacts: For questions or concerns you should contact:  Dr. Skeet Latch at [place]  Dr. Lahoma Crocker at [phone]  Dr. Everitt Amber at [phone]  Joylene John, NP at [phone]  After Hours: call [phone] and ask for the GYN Oncologist on call  Myomectomy, Care After Refer to this sheet in the next few weeks. These instructions provide you with information on caring for yourself after your procedure. Your health care provider may also give you more specific instructions. Your treatment has been planned according to current medical practices, but problems sometimes occur. Call your health care provider if you have any problems or  questions after your procedure. WHAT TO EXPECT AFTER THE PROCEDURE After your procedure, it is typical to have the following:  Pain in your abdomen, especially at any incision sites. You will be given pain medicine to control the pain.  Tiredness. This is a normal part of the recovery process. Your energy level will return to normal over the next several weeks.  Constipation.  Vaginal bleeding. This is normal and should stop after 1-2 weeks. HOME CARE INSTRUCTIONS   Only take over-the-counter or prescription medicines as directed by your health care provider. Avoid aspirin because it can cause bleeding.  Do not douche, use tampons, or have sexual intercourse until given permission by your health care provider.  Remove or change any bandages (dressings) as directed by your health care provider.  Take showers instead of baths as directed by your health care provider.  You will probably be able to go back to your normal routine after a few days. Do not do anything that requires extra effort until your health care provider says it is okay. Do not lift anything heavier than 15 pounds (6.8 kg) until your health care provider approves.  Walk daily but take frequent rest breaks if you tire easily.  Continue to practice deep breathing and coughing. If it hurts to cough, try holding a pillow against your belly as you cough.  If you become constipated, you may:  Use a mild laxative if your health care  provider approves.  Add more fruit and bran to your diet.  Drink enough fluids to keep your urine clear or pale yellow.  Take your temperature twice a day and write it down.  Do not drink alcohol.  Do not drive until your health care provider approves.  Have someone help you at home for 1 week or until you can do your own household activities.  Follow up with your health care provider as directed. SEEK MEDICAL CARE IF:  You have a fever.  You have increasing abdominal pain that is  not relieved with medicine.  You have nausea, vomiting, or diarrhea.  You have pain when you urinate, or you have blood in your urine.  You have a rash on your body.  You have pain or redness where your IV access tube was inserted.  You have redness, swelling, or any kind of drainage from an incision. SEEK IMMEDIATE MEDICAL CARE IF:   You have weakness or lightheadedness.  You have pain, swelling, or redness in your legs.  You have chest pain.  You faint.  You have shortness of breath.  You have heavy vaginal bleeding.  Your incision is opening up. Document Released: 12/17/2010 Document Revised: 05/17/2013 Document Reviewed: 03/08/2013 [facility] Patient Information 2015 [place], [place]. This information is not intended to replace advice given to you by your health care provider. Make sure you discuss any questions you have with your health care provider.  Oxycodone tablets or capsules What is this medicine? OXYCODONE (ox i KOE done) is a pain reliever. It is used to treat moderate to severe pain. This medicine may be used for other purposes; ask your health care provider or pharmacist if you have questions. COMMON BRAND NAME(S): Dazidox, Endocodone, OXECTA, OxyIR, Percolone, Roxicodone What should I tell my health care provider before I take this medicine? They need to know if you have any of these conditions: -Addison's disease -brain tumor -drug abuse or addiction -head injury -heart disease -if you frequently drink alcohol containing drinks -kidney disease or problems going to the bathroom -liver disease -lung disease, asthma, or breathing problems -mental problems -an unusual or allergic reaction to oxycodone, codeine, hydrocodone, morphine, other medicines, foods, dyes, or preservatives -pregnant or trying to get pregnant -breast-feeding How should I use this medicine? Take this medicine by mouth with a glass of water. Follow the directions on the prescription  label. You can take it with or without food. If it upsets your stomach, take it with food. Take your medicine at regular intervals. Do not take it more often than directed. Do not stop taking except on your doctor's advice. Some brands of this medicine, like Oxecta, have special instructions. Ask your doctor or pharmacist if these directions are for you: Do not cut, crush or chew this medicine. Swallow only one tablet at a time. Do not wet, soak, or lick the tablet before you take it. Talk to your pediatrician regarding the use of this medicine in children. Special care may be needed. Overdosage: If you think you have taken too much of this medicine contact a poison control center or emergency room at once. NOTE: This medicine is only for you. Do not share this medicine with others. What if I miss a dose? If you miss a dose, take it as soon as you can. If it is almost time for your next dose, take only that dose. Do not take double or extra doses. What may interact with this medicine? -alcohol -antihistamines -certain medicines used  for nausea like chlorpromazine, droperidol -erythromycin -ketoconazole -medicines for depression, anxiety, or psychotic disturbances -medicines for sleep -muscle relaxants -naloxone -naltrexone -narcotic medicines (opiates) for pain -nilotinib -phenobarbital -phenytoin -rifampin -ritonavir -voriconazole This list may not describe all possible interactions. Give your health care provider a list of all the medicines, herbs, non-prescription drugs, or dietary supplements you use. Also tell them if you smoke, drink alcohol, or use illegal drugs. Some items may interact with your medicine. What should I watch for while using this medicine? Tell your doctor or health care professional if your pain does not go away, if it gets worse, or if you have new or a different type of pain. You may develop tolerance to the medicine. Tolerance means that you will need a higher  dose of the medicine for pain relief. Tolerance is normal and is expected if you take this medicine for a long time. Do not suddenly stop taking your medicine because you may develop a severe reaction. Your body becomes used to the medicine. This does NOT mean you are addicted. Addiction is a behavior related to getting and using a drug for a non-medical reason. If you have pain, you have a medical reason to take pain medicine. Your doctor will tell you how much medicine to take. If your doctor wants you to stop the medicine, the dose will be slowly lowered over time to avoid any side effects. You may get drowsy or dizzy when you first start taking this medicine or change doses. Do not drive, use machinery, or do anything that may be dangerous until you know how the medicine affects you. Stand or sit up slowly. There are different types of narcotic medicines (opiates) for pain. If you take more than one type at the same time, you may have more side effects. Give your health care provider a list of all medicines you use. Your doctor will tell you how much medicine to take. Do not take more medicine than directed. Call emergency for help if you have problems breathing. This medicine will cause constipation. Try to have a bowel movement at least every 2 to 3 days. If you do not have a bowel movement for 3 days, call your doctor or health care professional. Your mouth may get dry. Drinking water, chewing sugarless gum, or sucking on hard candy may help. See your dentist every 6 months. What side effects may I notice from receiving this medicine? Side effects that you should report to your doctor or health care professional as soon as possible: -allergic reactions like skin rash, itching or hives, swelling of the face, lips, or tongue -breathing problems -confusion -feeling faint or lightheaded, falls -trouble passing urine or change in the amount of urine -unusually weak or tired Side effects that usually  do not require medical attention (report to your doctor or health care professional if they continue or are bothersome): -constipation -dry mouth -itching -nausea, vomiting -upset stomach This list may not describe all possible side effects. Call your doctor for medical advice about side effects. You may report side effects to FDA at 1-800-FDA-1088. Where should I keep my medicine? Keep out of the reach of children. This medicine can be abused. Keep your medicine in a safe place to protect it from theft. Do not share this medicine with anyone. Selling or giving away this medicine is dangerous and against the law. Store at room temperature between 15 and 30 degrees C (59 and 86 degrees F). Protect from light. Keep container tightly  closed. This medicine may cause accidental overdose and death if it is taken by other adults, children, or pets. Flush any unused medicine down the toilet to reduce the chance of harm. Do not use the medicine after the expiration date. NOTE: This sheet is a summary. It may not cover all possible information. If you have questions about this medicine, talk to your doctor, pharmacist, or health care provider.  2015, [employer]. (2013-04-06 13:43:33)

## 2014-06-27 NOTE — Progress Notes (Signed)
Patient stating she feels slightly weak when out of bed and remains slightly SOB.  SOB has been going on for several weeks prior to surgery and the patient states she thought it was from her fibroids.  Pre-op chest xray WNL and O2 sats around 100%.  Continue to follow at this time per Dr. Delsa Sale.

## 2014-06-27 NOTE — Care Management Note (Signed)
    Page 1 of 1   06/27/2014     1:28:59 PM CARE MANAGEMENT NOTE 06/27/2014  Patient:  CHOSEN, GARRON   Account Number:  [id number]  Date Initiated:  06/27/2014  Documentation initiated by:  [facility]  Subjective/Objective Assessment:   adm: EXPLORATORY LAPAROTOMY , MYOMECTOMY.     Action/Plan:   DC planning and UR   Anticipated DC Date:  06/27/2014   Anticipated DC Plan:  [place]  CM consult      Choice offered to / List presented to:  C-1 Patient           Status of service:  Completed, signed off Medicare Important Message given?   (If response is "NO", the following Medicare IM given date fields will be blank) Date Medicare IM given:   Medicare IM given by:   Date Additional Medicare IM given:   Additional Medicare IM given by:    Discharge Disposition:  HOME/SELF CARE  Per UR Regulation:    If discussed at Long Length of Stay Meetings, dates discussed:    Comments:  06/27/14 13:25 CM completed review; pt to go home self care.  No other CM needs were communicated.  Mariane Masters, BSN, CM [phone].

## 2014-06-27 NOTE — Plan of Care (Signed)
Problem: Phase II Progression Outcomes Goal: Pain controlled on oral analgesia Outcome: Completed/Met Date Met:  06/27/14     

## 2014-06-27 NOTE — Plan of Care (Signed)
Problem: Phase I Progression Outcomes Goal: Pain controlled with appropriate interventions Outcome: Completed/Met Date Met:  06/27/14 Goal: Admission history reviewed Outcome: Completed/Met Date Met:  06/27/14 Goal: Dangle/OOB as tolerated per MD order Outcome: Completed/Met Date Met:  06/27/14 Goal: VS, stable, temp < 100.4 degrees F Outcome: Completed/Met Date Met:  06/27/14 Goal: I & O every 4 hrs or as ordered Outcome: Completed/Met Date Met:  06/27/14 Goal: IS, TCDB as ordered Outcome: Completed/Met Date Met:  06/27/14 Goal: Other Phase I Outcomes/Goals Outcome: Completed/Met Date Met:  06/27/14  Problem: Phase II Progression Outcomes Goal: Foley discontinued Outcome: Completed/Met Date Met:  06/27/14     

## 2014-06-27 NOTE — Discharge Summary (Signed)
Physician Discharge Summary  Patient ID: Abigail Cherry MRN: [id number] DOB/AGE: 35-Feb-1982 35 y.o.  Admit date: 06/26/2014 Discharge date: 06/28/2014  Admission Diagnoses: Uterine fibroid  Discharge Diagnoses:  Principal Problem:   Uterine fibroid   Discharged Condition:  The patient is in good condition and stable for discharge.    Hospital Course: On 06/26/2014, the patient underwent the following: Procedure(s):  EXPLORATORY LAPAROTOMY , MYOMECTOMY.  The postoperative course was uneventful.  She was discharged to home on postoperative day 2 tolerating a regular diet and passing flatus.  Consults: None  Significant Diagnostic Studies: None  Treatments: surgery: see above  Discharge Exam: Blood pressure 118/79, pulse 67, temperature 98.2 F (36.8 C), temperature source Oral, resp. rate 16, height 5\' 5"  (1.651 m), weight 197 lb (89.359 kg), last menstrual period 06/05/2014, SpO2 99 %. General appearance: alert, cooperative and no distress Resp: clear to auscultation bilaterally Cardio: regular rate and rhythm, S1, S2 normal, no murmur, click, rub or gallop GI: soft, non-tender; bowel sounds normal; no masses,  no organomegaly Extremities: extremities normal, atraumatic, no cyanosis or edema Incision/Wound: Midline incision with steri strips, steris stained with sanguinous drainage, steris replaced and silver nitrate applied to superficial aspect of the incision by Dr. Delsa Sale, no active bleeding or drainage noted after  Disposition: 01-Home or Self Care      Discharge Instructions    Call MD for:  difficulty breathing, headache or visual disturbances    Complete by:  As directed      Call MD for:  extreme fatigue    Complete by:  As directed      Call MD for:  hives    Complete by:  As directed      Call MD for:  persistant dizziness or light-headedness    Complete by:  As directed      Call MD for:  persistant nausea and vomiting    Complete by:  As directed       Call MD for:  redness, tenderness, or signs of infection (pain, swelling, redness, odor or green/yellow discharge around incision site)    Complete by:  As directed      Call MD for:  severe uncontrolled pain    Complete by:  As directed      Call MD for:  temperature >100.4    Complete by:  As directed      Diet - low sodium heart healthy    Complete by:  As directed      Driving Restrictions    Complete by:  As directed   No driving for 2 weeks.  Do not take narcotics and drive.     Increase activity slowly    Complete by:  As directed      Lifting restrictions    Complete by:  As directed   No lifting greater than 10 lbs.     Sexual Activity Restrictions    Complete by:  As directed   No sexual activity, nothing in the vagina, for 6 weeks.            Medication List    TAKE these medications        fexofenadine 180 MG tablet  Commonly known as:  ALLEGRA  Take 180 mg by mouth every evening.     oxyCODONE 5 MG immediate release tablet  Commonly known as:  Oxy IR/ROXICODONE  Take 1-2 tablets (5-10 mg total) by mouth every 4 (four) hours as needed for severe pain.  sodium chloride 0.65 % Soln nasal spray  Commonly known as:  OCEAN  Place 2 sprays into both nostrils daily as needed for congestion.       Follow-up Information    Follow up with Janie Morning, MD On 07/12/2014.   Specialty:  Obstetrics and Gynecology   Why:  at 2:45pm at the [facility] for post-op check   Contact information:   [place] [address] [phone]       Greater than thirty minutes were spend for face to face discharge instructions and discharge orders/summary in EPIC.   Signed: CROSS, MELISSA DEAL 06/28/2014, 9:25 AM

## 2014-06-28 ENCOUNTER — Encounter (HOSPITAL_COMMUNITY): Payer: Self-pay | Admitting: Gynecologic Oncology

## 2014-06-28 NOTE — Discharge Summary (Signed)
Reviewed discharge instructions including follow-up appointment, medications, and incision care.  Pt verbalized understanding and was able to "teach back" RN's instructions without questions.  Pt being d/c into care of family.

## 2014-07-10 ENCOUNTER — Encounter: Payer: Self-pay | Admitting: Gynecologic Oncology

## 2014-07-10 ENCOUNTER — Telehealth: Payer: Self-pay | Admitting: Gynecologic Oncology

## 2014-07-10 NOTE — Telephone Encounter (Signed)
Office Visit:  GYN ONCOLOGY  CC:S/p myomectomy.  Post op check.  Assessment/Plan:  [name]  is a 33 y.o.  year old who is seen in consultation at the request of Dr Maudie Mercury for an enlarging pelvic mass. She underwent exp laparotomy with multiple myomectomies  06/26/2014.  All path benign.   F/U with Dr Rodena Goldmann in 6 months Follow-up with Gyn Onc PRN    HPI: Abigail Cherry is a 33 year old woman (G0) who has a several month history of an enlarging pelvic/abdominal mass, constipation and urinary frequency/decreased bladder capacity. She has begun to feel symptoms of abdominal discomfort. She sought evaluation with Dr Maudie Mercury her primary care physician who performed an examination which identified an abdomino-pelvic mass and ordered CT and US imaging.  The CT of the abdomen and pelvis on 05/10/14 showed a uterus with an irregular contour with multiple masses arising from the uterus eccentrically. There is diffuse endometrial thickening (this scan was performed during the patient's menstrual period). There is a mass extending from the right lower to mid pelvis into the upper pelvis and abdomen. It crosses the midline and measures 18.2x22.2x9.7cm. It is heterogeneously solid with patchy enhancement.  There was no ascites, lymphadenopathy, omental disease or upper abdominal disease. It is unclear if the mass originates from the uterus or right ovary.  The US of the pelvis on 05/08/14 showed a midline mass extending from the umbilicus to the lower pelvic area measuring 25.6x9.7x16.3cm. The [place] favored it being a myoma.  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  CA 125 19 AFP wnl Hgb 14.5On 05/26/2014 she underwent exp laparotomy with multiple myomectomy.  Final path c/w leiomyomata.   Social Hx:   History   Social History  . Marital Status: Single    Spouse Name: N/A    Number of Children: N/A   . Years of Education: N/A   Occupational History  . Not on file.   Social History Main Topics  . Smoking status: Current Every Day Smoker -- 0.50 packs/day for 20 years    Types: Cigarettes  . Smokeless tobacco: Not on file  . Alcohol Use: Yes     Comment: 4 beers per week  . Drug Use: No  . Sexual Activity: Yes     Comment: female partner   Other Topics Concern  . Not on file   Social History Narrative    Past Surgical Hx:  Past Surgical History  Procedure Laterality Date  . Abdominal hysterectomy N/A 06/26/2014    Procedure: EXPLORATORY LAPAROTOMY ;  Surgeon: Janie Morning, MD;  Location: [facility];  Service: Gynecology;  Laterality: N/A;  . Myomectomy N/A 06/26/2014    Procedure: MYOMECTOMY;  Surgeon: Janie Morning, MD;  Location: [facility];  Service: Gynecology;  Laterality: N/A;    Past Medical Hx:  Past Medical History  Diagnosis Date  . Chicken pox   . Shortness of breath dyspnea     due to mass     Past Gynecological History:  LMP 05/04/14. G0. Engages in only same sex relationships.  Patient's last menstrual period was 06/05/2014.  Family Hx:  Family History  Problem Relation Age of Onset  . Alcohol abuse      parent  . Arthritis      parent  . Hypertension      parent  . Mental illness  parent  . Mental illness Other   . Diabetes      parent  . Diabetes Other   . Hypertension Father   . Diabetes Father   . Diabetes Sister     Review of Systems:  Constitutional  Feels well,    ENT Normal appearing ears and nares bilaterally Cardiovascular  No chest pain,  Mild shortness of breath, no lower extremity edema  Pulmonary  No cough or wheeze. Mild SOB Gastro Intestinal  No nausea, vomitting, or diarrhoea. No bright red blood per rectum, no abdominal pain, change in bowel movement, or constipation.  Genito Urinary  + frequency, No urgency or dysuria,  Musculo Skeletal  No myalgia, arthralgia, joint swelling or pain  Neurologic  No  weakness, numbness, change in gait,  Psychology  No depression, anxiety, insomnia.    Physical Exam:  WD in NAD Neck  Supple NROM, without any enlargements.  Lymph Node Survey No cervical supraclavicular or inguinal adenopathy Cardiovascular  Pulse normal rate, regularity and rhythm. S1 and S2 normal.  Lungs  Clear to auscultation bilateraly, without wheezes/crackles/rhonchi. Good air movement.  Skin  No rash/lesions/breakdown  Psychiatry  Alert and oriented to person, place, and time, teary about the possibility of losing her uterus.  Abdomen  Normoactive bowel sounds, abdomen soft, non-tender and mildly overweight without evidence of hernia. There is a large minimally mobile mass appreciated in the lower abdomen extending from the right pelvis towards the left upper abdomen. It is firm and nontender. Back No CVA tenderness Genito Urinary  Vulva/vagina: Normal external female genitalia.  No lesions. No discharge or bleeding. Increased pubic hair distribution on medial thighs.  Bladder/urethra:  No lesions or masses, well supported bladder  Vagina: normal in appearance  Cervix: Normal appearing, no lesions.  Uterus: Difficult to discretely appreciate due to large pelvic mass, however the uterus itself to me feels separate from the mass and is moderately globular and mobile.  Adnexa: Large pelvic mass filling pelvis and extending to the upper abdomen. Smooth and regular to palpate.  Extremities  No bilateral cyanosis, clubbing or edema.

## 2014-07-12 ENCOUNTER — Ambulatory Visit: Payer: BC Managed Care – PPO | Attending: Gynecologic Oncology | Admitting: Gynecologic Oncology

## 2014-07-12 ENCOUNTER — Other Ambulatory Visit: Payer: Self-pay | Admitting: Gynecologic Oncology

## 2014-07-12 ENCOUNTER — Encounter: Payer: Self-pay | Admitting: Gynecologic Oncology

## 2014-07-12 VITALS — BP 129/81 | HR 84 | Temp 98.8°F | Resp 20 | Ht 65.0 in | Wt 188.4 lb

## 2014-07-12 DIAGNOSIS — R1900 Intra-abdominal and pelvic swelling, mass and lump, unspecified site: Secondary | ICD-10-CM | POA: Insufficient documentation

## 2014-07-12 DIAGNOSIS — D252 Subserosal leiomyoma of uterus: Secondary | ICD-10-CM

## 2014-07-12 DIAGNOSIS — R938 Abnormal findings on diagnostic imaging of other specified body structures: Secondary | ICD-10-CM | POA: Insufficient documentation

## 2014-07-12 DIAGNOSIS — Z6831 Body mass index (BMI) 31.0-31.9, adult: Secondary | ICD-10-CM | POA: Insufficient documentation

## 2014-07-12 DIAGNOSIS — F17210 Nicotine dependence, cigarettes, uncomplicated: Secondary | ICD-10-CM | POA: Insufficient documentation

## 2014-07-12 MED ORDER — OXYCODONE HCL 5 MG PO TABS: 5.0000 mg | ORAL_TABLET | ORAL | Status: DC | PRN

## 2014-07-12 NOTE — Patient Instructions (Signed)
  F/U with Dr Rodena Goldmann in 6 months Follow-up with Gyn Onc PRN   HAPPY HOLIDAYS!   Thank you very much Abigail Cherry for allowing me to provide care for you today.  I appreciate your confidence in choosing our Gynecologic Oncology team.  If you have any questions about your visit today please call our office and we will get back to you as soon as possible.  Please consider using the website Medlineplus.gov as an Geneticist, molecular.   Francetta Found. Rajiv Parlato MD., PhD Gynecologic Oncology

## 2014-07-12 NOTE — Progress Notes (Signed)
Office Visit:  GYN ONCOLOGY  CC:S/p myomectomy.  Post op check.  Assessment/Plan:  [name]  is a 33 y.o.  year old who is seen in consultation at the request of Abigail Cherry for an enlarging pelvic mass. She underwent exp laparotomy with multiple myomectomies  06/26/2014.  All path benign.   F/U with Abigail Cherry in 6 months Follow-up with Gyn Onc PRN    HPI: Abigail Cherry (G0) who has a several month history of an enlarging pelvic/abdominal mass, constipation and urinary frequency/decreased bladder capacity. She has begun to feel symptoms of abdominal discomfort. She sought evaluation with Abigail Cherry her primary care physician who performed an examination which identified an abdomino-pelvic mass and ordered CT and US imaging.  The CT of the abdomen and pelvis on 05/10/14 showed a uterus with an irregular contour with multiple masses arising from the uterus eccentrically. There is diffuse endometrial thickening (this scan was performed during the patient's menstrual period). There is a mass extending from the right lower to mid pelvis into the upper pelvis and abdomen. It crosses the midline and measures 18.2x22.2x9.7cm. It is heterogeneously solid with patchy enhancement.  There was no ascites, lymphadenopathy, omental disease or upper abdominal disease. It is unclear if the mass originates from the uterus or right ovary.  The US of the pelvis on 05/08/14 showed a midline mass extending from the umbilicus to the lower pelvic area measuring 25.6x9.7x16.3cm. The [place] favored it being a myoma.  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  CA 125 19 AFP wnl Hgb 14.5On 05/26/2014 she underwent exp laparotomy with multiple myomectomy.  Final path c/w leiomyomata.   Social Hx:   History   Social History  . Marital Status: Single    Spouse Name: N/A    Number of Children: N/A   . Years of Education: N/A   Occupational History  . Not on file.   Social History Main Topics  . Smoking status: Current Every Day Smoker -- 0.50 packs/day for 20 years    Types: Cigarettes  . Smokeless tobacco: Not on file  . Alcohol Use: Yes     Comment: 4 beers per week  . Drug Use: No  . Sexual Activity: Yes     Comment: female partner   Other Topics Concern  . Not on file   Social History Narrative    Past Surgical Hx:  Past Surgical History  Procedure Laterality Date  . Abdominal hysterectomy N/A 06/26/2014    Procedure: EXPLORATORY LAPAROTOMY ;  Surgeon: Abigail Morning, MD;  Location: [facility];  Service: Gynecology;  Laterality: N/A;  . Myomectomy N/A 06/26/2014    Procedure: MYOMECTOMY;  Surgeon: Abigail Morning, MD;  Location: [facility];  Service: Gynecology;  Laterality: N/A;    Past Medical Hx:  Past Medical History  Diagnosis Date  . Chicken pox   . Shortness of breath dyspnea     due to mass     Past Gynecological History:  LMP 05/04/14. G0. Engages in only same sex relationships.  Patient's last menstrual period was 06/05/2014.  Family Hx:  Family History  Problem Relation Age of Onset  . Alcohol abuse      parent  . Arthritis      parent  . Hypertension      parent  . Mental illness  parent  . Mental illness Other   . Diabetes      parent  . Diabetes Other   . Hypertension Father   . Diabetes Father   . Diabetes Sister     Review of Systems:  Constitutional  Feels well,    Genito Urinary  + frequency, No urgency or dysuria, normal menses last week. Musculo Skeletal  No myalgia, arthralgia, joint swelling or pain  Neurologic  No weakness, numbness, change in gait,    Physical Exam:  BP 129/81 mmHg  Pulse 84  Temp(Src) 98.8 F (37.1 C) (Oral)  Resp 20  Ht 5\' 5"  (1.651 m)  Wt 188 lb 6.4 oz (85.458 kg)  BMI 31.35 kg/m2  LMP 06/05/2014  WD in NAD Neck  Supple NROM, without any enlargements.  Skin  No  rash/lesions/breakdown  Abdomen  Normoactive bowel sounds, abdomen soft, non-tender and obeses.  Skin separation of the upper incision.  Silver nitrate applied and the edges were reapproximated with steri strips. Back No CVA tenderness   Extremities  No bilateral cyanosis, clubbing or edema.

## 2015-07-10 ENCOUNTER — Ambulatory Visit
Admission: RE | Admit: 2015-07-10 | Discharge: 2015-07-10 | Disposition: A | Discharge status: Home / Self Care | Payer: BLUE CROSS/BLUE SHIELD | Source: Ambulatory Visit | Attending: Internal Medicine | Admitting: Internal Medicine

## 2015-07-10 ENCOUNTER — Other Ambulatory Visit: Payer: Self-pay | Admitting: Internal Medicine

## 2015-07-10 DIAGNOSIS — R0600 Dyspnea, unspecified: Secondary | ICD-10-CM

## 2015-11-13 DIAGNOSIS — J069 Acute upper respiratory infection, unspecified: Secondary | ICD-10-CM | POA: Diagnosis not present

## 2016-06-15 DIAGNOSIS — J069 Acute upper respiratory infection, unspecified: Secondary | ICD-10-CM | POA: Diagnosis not present

## 2016-06-15 DIAGNOSIS — H109 Unspecified conjunctivitis: Secondary | ICD-10-CM | POA: Diagnosis not present

## 2016-09-21 IMAGING — CT CT ABD-PELV W/ CM
2 of 4 series · 16 of 46 positions shown, 18 images · IV contrast (Omnipaque 300)
Comparison: Abdominal ultrasound May 08, 2014

CLINICAL DATA: Palpable abdominal mass over 7 months duration

EXAM:
CT ABDOMEN AND PELVIS WITH CONTRAST
TECHNIQUE: Multidetector CT imaging of the abdomen and pelvis was performed
using the standard protocol following bolus administration of
intravenous contrast. Oral contrast was also administered.
CONTRAST:  100mL OMNIPAQUE IOHEXOL 300 MG/ML  SOLN

[Series 2: abd/ pel 5mm · axial · 0.69mm/px · z∈[-450,-20]mm · 13 of 94 slices shown, 15 images]
[im 4/94  soft-tissue]
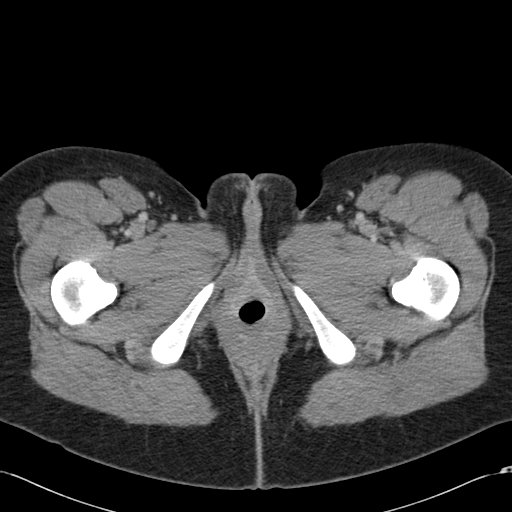
[im 4/94  bone]
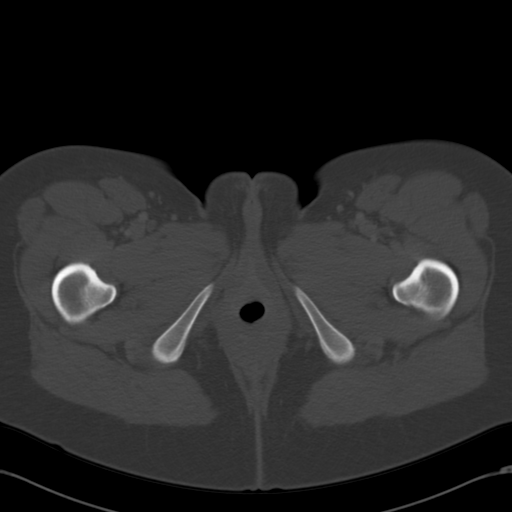
[im 11/94  soft-tissue]
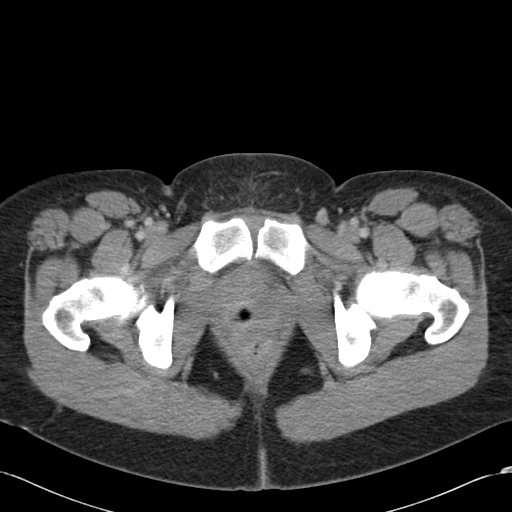
[im 18/94  soft-tissue]
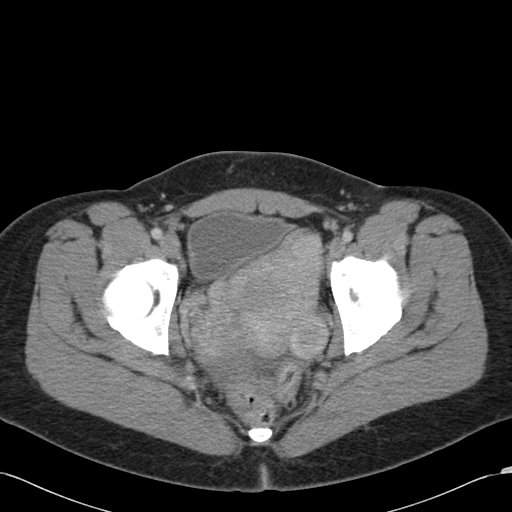
[im 26/94  soft-tissue]
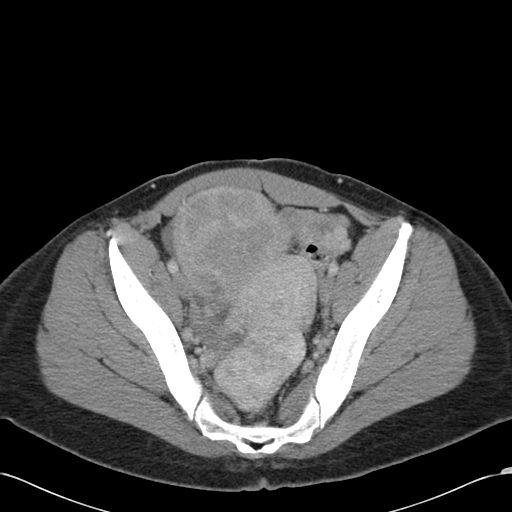
[im 33/94  soft-tissue]
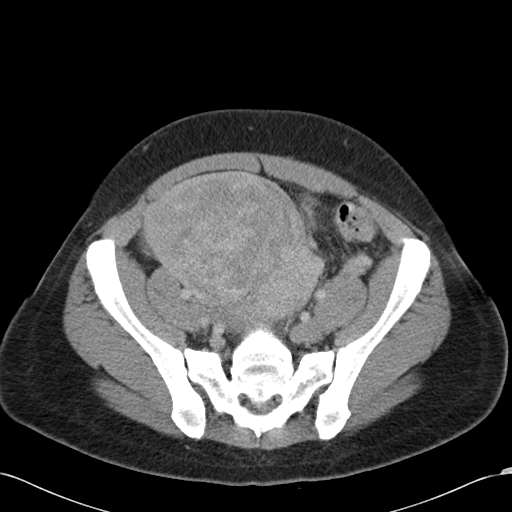
[im 40/94  soft-tissue]
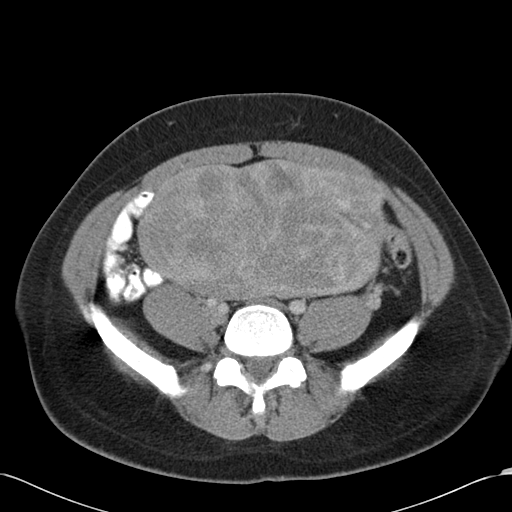
[im 47/94  soft-tissue]
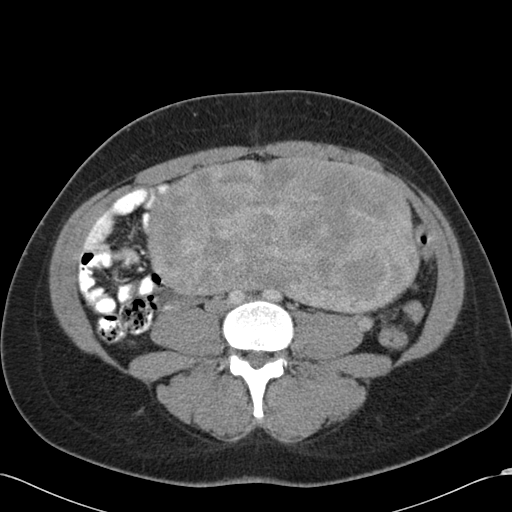
[im 54/94  soft-tissue]
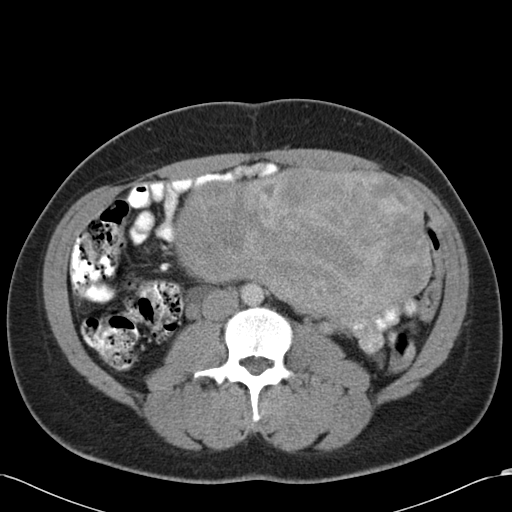
[im 61/94  soft-tissue]
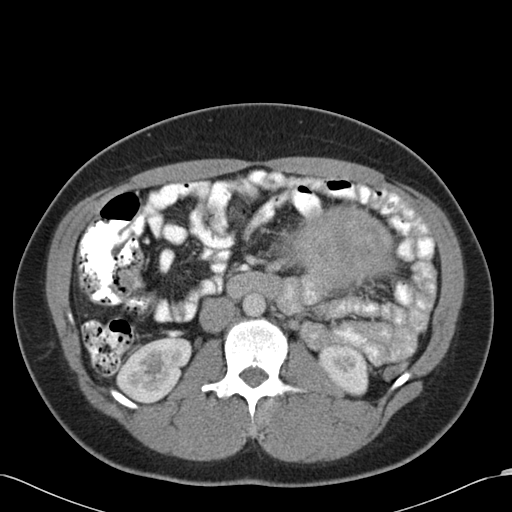
[im 61/94  bone]
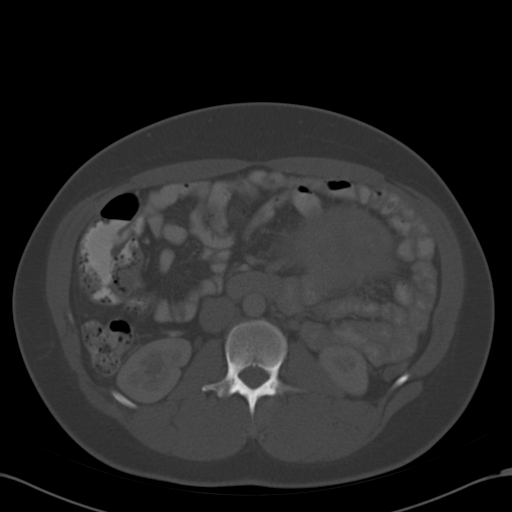
[im 68/94  soft-tissue]
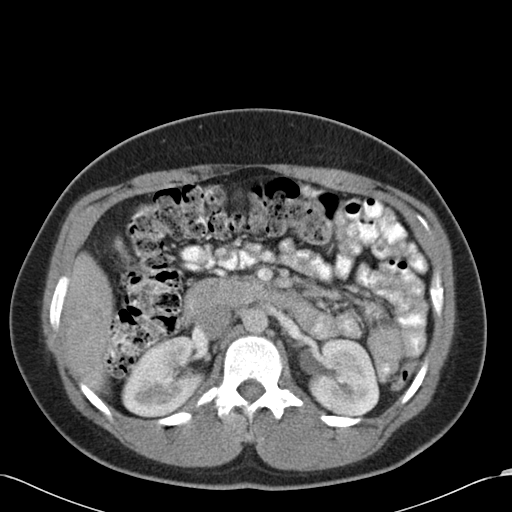
[im 76/94  soft-tissue]
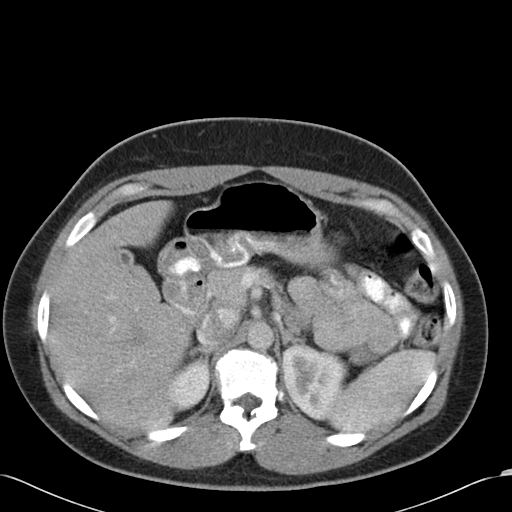
[im 83/94  soft-tissue]
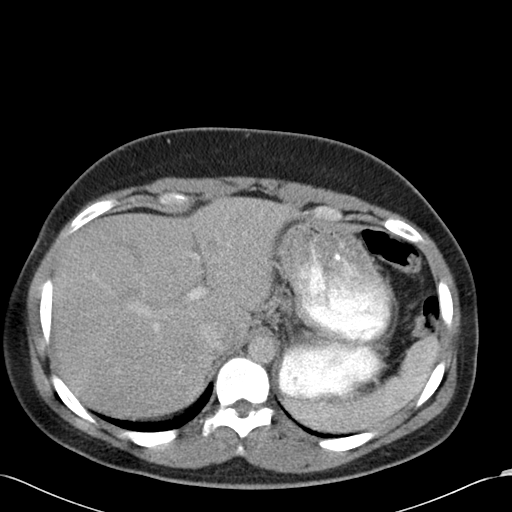
[im 90/94  soft-tissue]
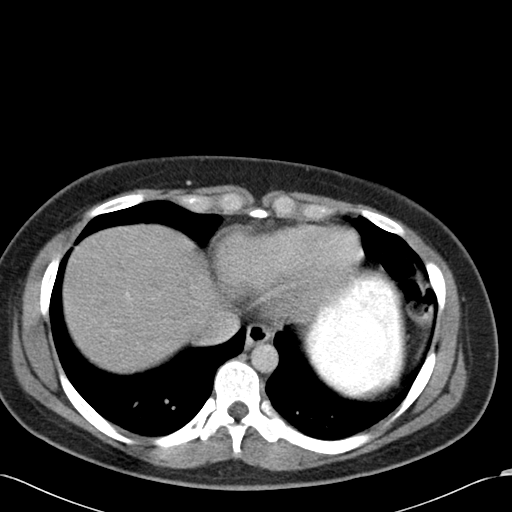

[Series 602: cor · coronal · 0.94mm/px · 3 of 118 slices shown]
[im 40/118  soft-tissue]
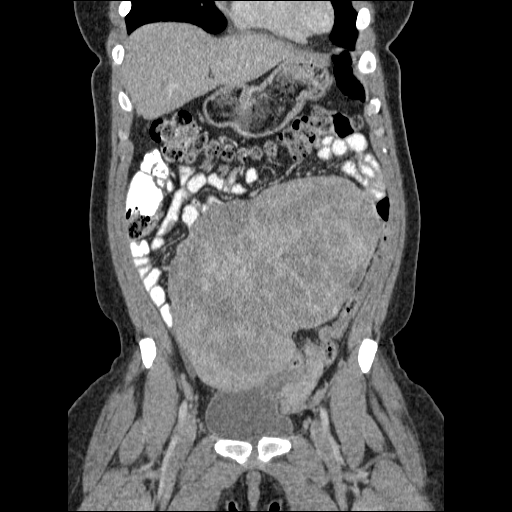
[im 53/118  soft-tissue]
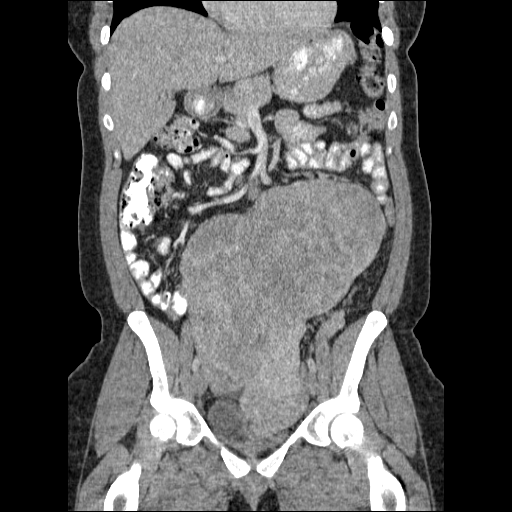
[im 66/118  soft-tissue]
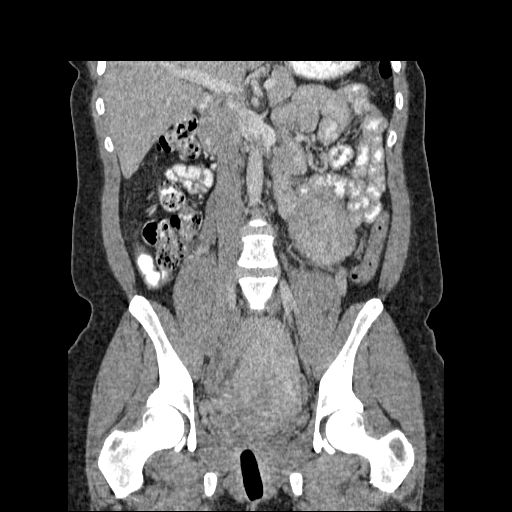

[16 of 46 positions shown; findings below may reference images not displayed]

FINDINGS: Lung bases are clear.

There is hepatic steatosis. No focal liver lesions are identified.
Gallbladder is contracted. There is no biliary duct dilatation.

Spleen, pancreas, and adrenals appear normal. Kidneys bilaterally
show no mass, calculus, or hydronephrosis. There is no renal or
ureteral calculus on either side.

In the pelvis, the uterus is irregular in contour with multiple
masses arising from the uterus eccentrically. There is diffuse
endometrial thickening. There is a small amount of free fluid in the
cul-de-sac.

There is a mass extending from the right lower to mid pelvis into
the upper pelvis and abdomen. This mass crosses the midline and
along its more superior aspect extends more toward the left. This
mass, which is heterogeneously solid in appearance and shows patchy
enhancement throughout its contour measures 18.2 cm in superior to
inferior dimension, 22.2 cm in right to left dimension, and 9.7 cm
in anterior-posterior dimension. This mass abuts the superior uterus
on the right but cannot be seen convincingly to arise from the
uterus.

There is no bowel obstruction. No free air or portal venous air.
There is moderate stool throughout the colon. There is adenopathy or
abscess in the abdomen or pelvis. There is no periappendiceal region
inflammation. There is no demonstrable abdominal aortic aneurysm.
There are no blastic or lytic bone lesions.
IMPRESSION: Solid mass measuring 18.2 x 22.2 x 9.7 cm which arises from the
right pelvis extending superiorly with mass extending more toward
the left in the mid to lower abdomen. This mass clearly abuts but
cannot be considered seen only seen to arise from the uterus. This
mass most likely represents either a large eccentric leiomyoma from
the uterus or possibly an ovarian fibroma which abuts the uterus in
the pelvis. In this age group, large uterine leiomyoma is
statistically more likely. There are smaller solid masses which
clearly arise from the uterus in the pelvis consistent with
leiomyomatous change. The endometrium is prominent. There is a small
amount of free fluid in the pelvis between the uterus and rectum
which possibly could indicate recent ovarian cyst rupture.

There is hepatic steatosis.  No focal liver lesions are identified.

No adenopathy.  No omental thickening or omental mass.

## 2016-10-21 ENCOUNTER — Encounter (HOSPITAL_COMMUNITY): Payer: Self-pay | Admitting: Family Medicine

## 2016-10-21 ENCOUNTER — Emergency Department (HOSPITAL_COMMUNITY)
Admission: EM | Admit: 2016-10-21 | Discharge: 2016-10-22 | Disposition: A | Discharge status: Home / Self Care | Payer: BLUE CROSS/BLUE SHIELD | Attending: Emergency Medicine | Admitting: Emergency Medicine

## 2016-10-21 DIAGNOSIS — K529 Noninfective gastroenteritis and colitis, unspecified: Secondary | ICD-10-CM

## 2016-10-21 DIAGNOSIS — Z79899 Other long term (current) drug therapy: Secondary | ICD-10-CM | POA: Insufficient documentation

## 2016-10-21 DIAGNOSIS — F17210 Nicotine dependence, cigarettes, uncomplicated: Secondary | ICD-10-CM | POA: Insufficient documentation

## 2016-10-21 LAB — POC URINE PREG, ED: PREG TEST UR: NEGATIVE

## 2016-10-21 MED ORDER — DICYCLOMINE HCL 10 MG/ML IM SOLN
20.0000 mg | Freq: Once | INTRAMUSCULAR | Status: AC
  Administered 2016-10-22: 20 mg via INTRAMUSCULAR
  Filled 2016-10-21: qty 2

## 2016-10-21 MED ORDER — SODIUM CHLORIDE 0.9 % IV BOLUS (SEPSIS)
1000.0000 mL | Freq: Once | INTRAVENOUS | Status: AC
  Administered 2016-10-22: 1000 mL via INTRAVENOUS

## 2016-10-21 MED ORDER — KETOROLAC TROMETHAMINE 30 MG/ML IJ SOLN
30.0000 mg | Freq: Once | INTRAMUSCULAR | Status: AC
  Administered 2016-10-22: 30 mg via INTRAVENOUS
  Filled 2016-10-21: qty 1

## 2016-10-21 MED ORDER — ONDANSETRON HCL 4 MG/2ML IJ SOLN
4.0000 mg | Freq: Once | INTRAMUSCULAR | Status: AC
  Administered 2016-10-22: 4 mg via INTRAVENOUS
  Filled 2016-10-21: qty 2

## 2016-10-21 NOTE — ED Notes (Signed)
Pt. Made aware for the need of urine. 

## 2016-10-21 NOTE — ED Triage Notes (Signed)
Patient reports she is experiencing lower abd pain with nausea, vomiting, diarrhea, and chills. Symptoms started today. Denies taking any medication for symptoms.

## 2016-10-21 NOTE — ED Notes (Signed)
Writer attempted blood work X 2 unsuccessful

## 2016-10-21 NOTE — ED Notes (Signed)
Pt ambulatory to restroom

## 2016-10-21 NOTE — ED Provider Notes (Signed)
[facility] DEPT Provider Note   CSN: [id number] Arrival date & time: 10/21/16  2215  By signing my name below, I, Oleh Genin, attest that this documentation has been prepared under the direction and in the presence of Lucious Zou, MD. Electronically Signed: Oleh Genin, [place]. 10/21/16. 11:09 PM.   History   Chief Complaint Chief Complaint  Patient presents with  . Abdominal Pain  . Chills    HPI Abigail Cherry is a 36 y.o. female with history of hypertension who presents to the ED for evaluation of abdominal pain. This patient states that in the last 12 hours she has experienced LLQ abdominal "cramping" with nausea, 4 instances of vomiting, and 4 instances of nonbloody diarrhea. She does have history of uterine fibroids which were resected. LMP was 1 week ago. She denies any urinary complaints. Her pain is partially alleviated following bowel movements.    The history is provided by the patient. No language interpreter was used.  Diarrhea   This is a new problem. The current episode started 12 to 24 hours ago. The problem occurs 2 to 4 times per day. The problem has not changed since onset.The stool consistency is described as watery. There has been no fever. Associated symptoms include abdominal pain, vomiting and myalgias. Pertinent negatives include no chills, no sweats, no arthralgias and no cough. She has tried nothing for the symptoms. The treatment provided no relief. Risk factors include ill contacts. Her past medical history does not include irritable bowel syndrome.  Emesis   This is a new problem. The current episode started 12 to 24 hours ago. The problem occurs 5 to 10 times per day. The problem has not changed since onset.The emesis has an appearance of stomach contents. There has been no fever. Associated symptoms include abdominal pain, diarrhea and myalgias. Pertinent negatives include no arthralgias, no chills, no cough, no fever and no sweats.     Past Medical History:  Diagnosis Date  . Chicken pox   . Shortness of breath dyspnea    due to mass     Patient Active Problem List   Diagnosis Date Noted  . Uterine fibroid 06/26/2014  . Pelvic mass in female 05/11/2014  . AR (allergic rhinitis) 09/20/2013  . Cervical cancer screening 09/20/2013  . Persistent headaches 09/20/2013  . Carpal tunnel syndrome on both sides 06/01/2012    Past Surgical History:  Procedure Laterality Date  . ABDOMINAL HYSTERECTOMY N/A 06/26/2014   Procedure: EXPLORATORY LAPAROTOMY ;  Surgeon: Janie Morning, MD;  Location: [facility];  Service: Gynecology;  Laterality: N/A;  . MYOMECTOMY N/A 06/26/2014   Procedure: MYOMECTOMY;  Surgeon: Janie Morning, MD;  Location: [facility];  Service: Gynecology;  Laterality: N/A;    OB History    No data available       Home Medications    Prior to Admission medications   Medication Sig Start Date End Date Taking? Authorizing Provider  fexofenadine (ALLEGRA) 180 MG tablet Take 180 mg by mouth every evening.    Historical Provider, MD  oxyCODONE (OXY IR/ROXICODONE) 5 MG immediate release tablet Take 1-2 tablets (5-10 mg total) by mouth every 4 (four) hours as needed for severe pain. 07/12/14   Janie Morning, MD  sodium chloride (OCEAN) 0.65 % SOLN nasal spray Place 2 sprays into both nostrils daily as needed for congestion.    Historical Provider, MD    Family History Family History  Problem Relation Age of Onset  . Alcohol abuse  parent  . Arthritis      parent  . Hypertension      parent  . Mental illness      parent  . Mental illness Other   . Diabetes      parent  . Diabetes Other   . Hypertension Father   . Diabetes Father   . Diabetes Sister     Social History Social History  Substance Use Topics  . Smoking status: Current Every Day Smoker    Packs/day: 0.50    Years: 20.00    Types: Cigarettes  . Smokeless tobacco: Never Used  . Alcohol use Yes     Comment: 4 beers per week      Allergies   Patient has no known allergies.   Review of Systems Review of Systems  Constitutional: Negative for chills and fever.  HENT: Negative for ear pain and sore throat.   Eyes: Negative for pain and visual disturbance.  Respiratory: Negative for cough and shortness of breath.   Cardiovascular: Negative for chest pain and palpitations.  Gastrointestinal: Positive for abdominal pain, diarrhea, nausea and vomiting.  Genitourinary: Negative for dysuria, flank pain, frequency, hematuria, vaginal bleeding and vaginal discharge.  Musculoskeletal: Positive for myalgias. Negative for arthralgias and back pain.  Skin: Negative for color change and rash.  Neurological: Negative for seizures and syncope.  All other systems reviewed and are negative.    Physical Exam Updated Vital Signs BP 141/96 (BP Location: Right Arm)   Pulse 94   Temp 98.7 F (37.1 C) (Oral)   Resp 18   Ht 5\' 7"  (1.702 m)   Wt 208 lb (94.3 kg)   LMP 10/07/2016   SpO2 100%   BMI 32.58 kg/m   Physical Exam  Constitutional: She is oriented to person, place, and time. She appears well-developed and well-nourished. No distress.  HENT:  Head: Normocephalic and atraumatic.  Mouth/Throat: Oropharynx is clear and moist. No oropharyngeal exudate.  Eyes: Conjunctivae are normal. Pupils are equal, round, and reactive to light.  Neck: Normal range of motion. Neck supple. Carotid bruit is not present. No tracheal deviation present.  Cardiovascular: Normal rate, regular rhythm, normal heart sounds and intact distal pulses.   No murmur heard. Pulmonary/Chest: Effort normal and breath sounds normal. No stridor. No respiratory distress. She has no wheezes. She has no rales.  Abdominal: Soft. She exhibits no mass. There is no tenderness. There is no rebound and no guarding.  Hyperactive bowel sounds.   Musculoskeletal: Normal range of motion. She exhibits no edema.  Neurological: She is alert and oriented to person,  place, and time. She displays normal reflexes.  Skin: Skin is warm and dry. Capillary refill takes less than 2 seconds.  Psychiatric: She has a normal mood and affect.  Nursing note and vitals reviewed.    ED Treatments / Results   Vitals:   10/21/16 2230  BP: 141/96  Pulse: 94  Resp: 18  Temp: 98.7 F (37.1 C)    DIAGNOSTIC STUDIES: Oxygen Saturation is 100 percent on room air which is normal by my interpretation.    COORDINATION OF CARE: 11:23 PM Discussed treatment plan with pt at bedside and pt agreed to plan.  Labs (all labs ordered are listed, but only abnormal results are displayed)  Radiology No results found.  Procedures Procedures (including critical care time)  Medications Ordered in ED  Medications  sodium chloride 0.9 % bolus 1,000 mL (not administered)  dicyclomine (BENTYL) injection 20 mg (not administered)  ondansetron (ZOFRAN) injection 4 mg (not administered)  ketorolac (TORADOL) 30 MG/ML injection 30 mg (not administered)     Final Clinical Impressions(s) / ED Diagnoses  Viral n/v/d.  Po challenged successfully.  Feel markedly improved.  Exam and vitals are benign and reassuring.   well appearing, normal vital signs.  No indication for imaging at this time.  Based on history and exam patient has been appropriately medically screened and emergency conditions excluded. Patient is stable for discharge at this time. Strict return precautions given for fever that is persistent, persistent vomiting, diarrhea, abdominal pain shortness of breath, chest pain, worsening symptomsor anyfurther problems or concerns.  Follow up with your PMD in 2 days for recheck   I personally performed the services described in this documentation, which was scribed in my presence. The recorded information has been reviewed and is accurate.  I personally performed the services described in this documentation, which was scribed in my presence. The recorded information has  been reviewed and is accurate.      Veatrice Kells, MD 10/22/16 [phone]

## 2016-10-22 LAB — URINALYSIS, ROUTINE W REFLEX MICROSCOPIC
Bilirubin Urine: NEGATIVE
Glucose, UA: NEGATIVE mg/dL
Hgb urine dipstick: NEGATIVE
Ketones, ur: NEGATIVE mg/dL
Leukocytes, UA: NEGATIVE
NITRITE: NEGATIVE
Protein, ur: 30 mg/dL — AB
Specific Gravity, Urine: 1.024 (ref 1.005–1.030)
pH: 8 (ref 5.0–8.0)

## 2016-10-22 LAB — I-STAT CHEM 8, ED
BUN: 11 mg/dL (ref 6–20)
CHLORIDE: 104 mmol/L (ref 101–111)
Calcium, Ion: 1.03 mmol/L — ABNORMAL LOW (ref 1.15–1.40)
Creatinine, Ser: 0.9 mg/dL (ref 0.44–1.00)
Glucose, Bld: 76 mg/dL (ref 65–99)
HCT: 40 % (ref 36.0–46.0)
Hemoglobin: 13.6 g/dL (ref 12.0–15.0)
POTASSIUM: 3.6 mmol/L (ref 3.5–5.1)
Sodium: 143 mmol/L (ref 135–145)
TCO2: 29 mmol/L (ref 0–100)

## 2016-10-22 NOTE — ED Notes (Signed)
Patient given sprite for PO challenge.  

## 2016-10-22 NOTE — ED Notes (Signed)
Pt ambulatory and independent at discharge.  

## 2017-02-24 ENCOUNTER — Emergency Department (HOSPITAL_COMMUNITY)
Admission: EM | Admit: 2017-02-24 | Discharge: 2017-02-25 | Disposition: A | Discharge status: Home / Self Care | Payer: BLUE CROSS/BLUE SHIELD | Attending: Emergency Medicine | Admitting: Emergency Medicine

## 2017-02-24 ENCOUNTER — Encounter (HOSPITAL_COMMUNITY): Payer: Self-pay | Admitting: Emergency Medicine

## 2017-02-24 DIAGNOSIS — F17210 Nicotine dependence, cigarettes, uncomplicated: Secondary | ICD-10-CM | POA: Insufficient documentation

## 2017-02-24 DIAGNOSIS — M25561 Pain in right knee: Secondary | ICD-10-CM | POA: Insufficient documentation

## 2017-02-24 MED ORDER — NAPROXEN 500 MG PO TABS: 500.0000 mg | ORAL_TABLET | Freq: Two times a day (BID) | ORAL | 0 refills | Status: DC

## 2017-02-24 MED ORDER — NAPROXEN 500 MG PO TABS
500.0000 mg | ORAL_TABLET | Freq: Once | ORAL | Status: AC
  Administered 2017-02-24: 500 mg via ORAL
  Filled 2017-02-24: qty 1

## 2017-02-24 NOTE — ED Provider Notes (Signed)
[facility] DEPT Provider Note   CSN: [id number] Arrival date & time: 02/24/17  2018     History   Chief Complaint Chief Complaint  Patient presents with  . Knee Pain    HPI Abigail Cherry is a 36 y.o. female who presents with right knee pain. 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 She developed right knee pain and swelling this morning which is worsened throughout the day. She has experienced this pain before but has never lasted this long or been this severe. She has never been evaluated for this before. She states that sometimes she feels like her knee gives out on her and she had a fall this morning. She feels like it is tight and states the pain is all across the anterior portion of the knee. She has never injured this knee before had surgery on it. She has not taken any medicines prior to arrival. She also notes she has right wrist pain but states that this has been present for "awhile" and does not have concerns about this at present.  HPI  Past Medical History:  Diagnosis Date  . Chicken pox   . Shortness of breath dyspnea    due to mass     Patient Active Problem List   Diagnosis Date Noted  . Uterine fibroid 06/26/2014  . Pelvic mass in female 05/11/2014  . AR (allergic rhinitis) 09/20/2013  . Cervical cancer screening 09/20/2013  . Persistent headaches 09/20/2013  . Carpal tunnel syndrome on both sides 06/01/2012    Past Surgical History:  Procedure Laterality Date  . ABDOMINAL HYSTERECTOMY N/A 06/26/2014   Procedure: EXPLORATORY LAPAROTOMY ;  Surgeon: Janie Morning, MD;  Location: [facility];  Service: Gynecology;  Laterality: N/A;  . MYOMECTOMY N/A 06/26/2014   Procedure: MYOMECTOMY;  Surgeon: Janie Morning, MD;  Location: [facility];  Service: Gynecology;  Laterality: N/A;    OB History    No data available       Home Medications    Prior to Admission medications   Medication Sig Start Date End Date Taking? Authorizing  Provider  ibuprofen (ADVIL,MOTRIN) 200 MG tablet Take 800 mg by mouth every 6 (six) hours as needed for headache, mild pain or moderate pain.    [provider]    Family History Family History  Problem Relation Age of Onset  . Alcohol abuse Unknown        parent  . Arthritis Unknown        parent  . Hypertension Unknown        parent  . Mental illness Unknown        parent  . Mental illness Other   . Diabetes Unknown        parent  . Diabetes Other   . Hypertension Father   . Diabetes Father   . Diabetes Sister     Social History Social History  Substance Use Topics  . Smoking status: Current Every Day Smoker    Packs/day: 0.50    Years: 20.00    Types: Cigarettes  . Smokeless tobacco: Never Used  . Alcohol use Yes     Comment: 4 beers per week     Allergies   Patient has no known allergies.   Review of Systems Review of Systems  Musculoskeletal: Positive for arthralgias and joint swelling.  Neurological: Negative for weakness.     Physical Exam Updated Vital Signs BP 136/86 (BP Location: Left Arm)  Pulse (!) 103   Temp 98.6 F (37 C) (Oral)   Resp 18   LMP 01/28/2017 (Exact Date)   SpO2 99%   Physical Exam  Constitutional: She is oriented to person, place, and time. She appears well-developed and well-nourished. No distress.  HENT:  Head: Normocephalic and atraumatic.  Eyes: Pupils are equal, round, and reactive to light. Conjunctivae are normal. Right eye exhibits no discharge. Left eye exhibits no discharge. No scleral icterus.  Neck: Normal range of motion.  Cardiovascular: Normal rate.   Pulmonary/Chest: Effort normal. No respiratory distress.  Abdominal: She exhibits no distension.  Musculoskeletal:  Right knee: Mild amount of medial swelling of the right knee. No deformity, or warmth. Minimal tenderness to palpation of medial and lateral joint lines. FROM. 5/5 strength. N/V intact.   Neurological: She is alert and oriented to  person, place, and time.  Skin: Skin is warm and dry.  Psychiatric: She has a normal mood and affect. Her behavior is normal.  Nursing note and vitals reviewed.    ED Treatments / Results  Labs (all labs ordered are listed, but only abnormal results are displayed) Labs Reviewed - No data to display  EKG  EKG Interpretation None       Radiology No results found.  Procedures Procedures (including critical care time)  Medications Ordered in ED Medications - No data to display   Initial Impression / Assessment and Plan / ED Course  I have reviewed the triage vital signs and the nursing notes.  Pertinent labs & imaging results that were available during my care of the patient were reviewed by me and considered in my medical decision making (see chart for details).  36 year old female with atraumatic right knee pain. Possibly related to arthritis. No indication for xray at this time and pt is comfortable with this. She has not tried any meds. Discussed RICE protocol and NSAIDs. Also will offer brace for comfort. Advised establish care with PCP. Work note given.  Final Clinical Impressions(s) / ED Diagnoses   Final diagnoses:  Acute pain of right knee    New Prescriptions New Prescriptions   No medications on file     Iris Pert 02/25/17 0101    Daleen Bo, MD 02/25/17 1728

## 2017-02-24 NOTE — Discharge Instructions (Signed)
Rest - please stay off right knee as much as possible Ice - ice for 20 minutes at a time, several times a day Compression - wear brace to provide support Elevate - elevate right leg above level of heart Naproxen - take with food. Take twice a day

## 2017-02-24 NOTE — ED Triage Notes (Signed)
Pt is c/o right knee pain that started this morning   Pt is also c/o right wrist pain  Pt states she has carpel tunnel syndrome

## 2017-07-28 ENCOUNTER — Ambulatory Visit (INDEPENDENT_AMBULATORY_CARE_PROVIDER_SITE_OTHER): Payer: BLUE CROSS/BLUE SHIELD | Admitting: Family Medicine

## 2017-07-28 ENCOUNTER — Encounter: Payer: Self-pay | Admitting: Family Medicine

## 2017-07-28 ENCOUNTER — Other Ambulatory Visit: Payer: Self-pay

## 2017-07-28 VITALS — BP 114/72 | HR 103 | Temp 99.0°F | Resp 16 | Ht 67.0 in | Wt 205.8 lb

## 2017-07-28 DIAGNOSIS — Z202 Contact with and (suspected) exposure to infections with a predominantly sexual mode of transmission: Secondary | ICD-10-CM | POA: Diagnosis not present

## 2017-07-28 DIAGNOSIS — M25511 Pain in right shoulder: Secondary | ICD-10-CM

## 2017-07-28 MED ORDER — MELOXICAM 7.5 MG PO TABS: 7.5000 mg | ORAL_TABLET | Freq: Every day | ORAL | 0 refills | Status: AC

## 2017-07-28 NOTE — Patient Instructions (Addendum)
     IF you received an x-ray today, you will receive an invoice from [facility]. Please contact [facility] at [phone] with questions or concerns regarding your invoice.   IF you received labwork today, you will receive an invoice from [facility]. Please contact [facility] at [phone] with questions or concerns regarding your invoice.   Our billing staff will not be able to assist you with questions regarding bills from these companies.  You will be contacted with the lab results as soon as they are available. The fastest way to get your results is to activate your My Chart account. Instructions are located on the last page of this paperwork. If you have not heard from us regarding the results in 2 weeks, please contact this office.     Shoulder Pain Many things can cause shoulder pain, including:  An injury to the area.  Overuse of the shoulder.  Arthritis.  The source of the pain can be:  Inflammation.  An injury to the shoulder joint.  An injury to a tendon, ligament, or bone.  Follow these instructions at home: Take these actions to help with your pain:  Squeeze a soft ball or a foam pad as much as possible. This helps to keep the shoulder from swelling. It also helps to strengthen the arm.  Take over-the-counter and prescription medicines only as told by your health care provider.  If directed, apply ice to the area: ? Put ice in a plastic bag. ? Place a towel between your skin and the bag. ? Leave the ice on for 20 minutes, 2-3 times per day. Stop applying ice if it does not help with the pain.  If you were given a shoulder sling or immobilizer: ? Wear it as told. ? Remove it to shower or bathe. ? Move your arm as little as possible, but keep your hand moving to prevent swelling.  Contact a health care provider if:  Your pain gets worse.  Your pain is not relieved with medicines.  New pain develops in your arm, hand, or fingers. Get  help right away if:  Your arm, hand, or fingers: ? Tingle. ? Become numb. ? Become swollen. ? Become painful. ? Turn white or blue. This information is not intended to replace advice given to you by your health care provider. Make sure you discuss any questions you have with your health care provider. Document Released: 05/06/2005 Document Revised: 03/22/2016 Document Reviewed: 11/19/2014 Elsevier Interactive Patient Education  2018 Elsevier Inc.  

## 2017-07-28 NOTE — Addendum Note (Signed)
Addended by: Delia Chimes A on: 07/28/2017 10:17 AM   Modules accepted: Orders

## 2017-07-28 NOTE — Progress Notes (Signed)
Chief Complaint  Patient presents with  . New Patient (Initial Visit)    establish care.  Pt would like to be testing for all STI's, r arm pain, ? rotator cuff injury- pt lifts on job and has hx of carpal tunnel in both hands.  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   Past Medical History:  Diagnosis Date  . Arthritis   . Chicken pox   . Shortness of breath dyspnea    due to mass     Current Outpatient Medications  Medication Sig Dispense Refill  . ibuprofen (ADVIL,MOTRIN) 200 MG tablet Take 800 mg by mouth every 6 (six) hours as needed for headache, mild pain or moderate pain.    . meloxicam (MOBIC) 7.5 MG tablet Take 1 tablet (7.5 mg total) by mouth daily. 30 tablet 0   No current facility-administered medications for this visit.     Allergies: No Known Allergies  Past Surgical History:  Procedure Laterality Date  . ABDOMINAL HYSTERECTOMY N/A 06/26/2014   Procedure: EXPLORATORY LAPAROTOMY ;  Surgeon: Janie Morning, MD;  Location: [facility];  Service: Gynecology;  Laterality: N/A;  . MYOMECTOMY N/A 06/26/2014   Procedure: MYOMECTOMY;  Surgeon: Janie Morning, MD;  Location: [facility];  Service: Gynecology;  Laterality: N/A;  . ROBOTIC  ASSISTED LAPAROSCOPIC VAGINAL HYSTERECTOMY WITH FIBROID REMOVAL      Social History   Socioeconomic History  . Marital status: Single    Spouse name: None  . Number of children: None  . Years of education: None  . Highest education level: None  Social Needs  . Financial resource strain: None  . Food insecurity - worry: None  . Food insecurity - inability: None  . Transportation needs - medical: None  . Transportation needs - non-medical: None  Occupational History  . None  Tobacco Use  . Smoking status: Current Every Day Smoker    Packs/day: 0.50    Years: 20.00    Pack years: 10.00    Types: Cigarettes  . Smokeless tobacco: Never Used  Substance and Sexual Activity  . Alcohol use: Yes    Comment: 4 beers per week  . Drug use: No  . Sexual activity: Yes    Comment: female partner  Other Topics Concern  . None  Social History Narrative  . None    Family History  Problem Relation Age of Onset  . Alcohol abuse Unknown        parent  . Arthritis Unknown        parent  . Hypertension Unknown        parent  . Mental illness Unknown  parent  . Mental illness Other   . Diabetes Unknown        parent  . Diabetes Other   . Hypertension Father   . Diabetes Father   . Diabetes Sister      ROS Review of Systems See HPI Constitution: No fevers or chills No malaise No diaphoresis Skin: No rash or itching Eyes: no blurry vision, no double vision GU: no dysuria or hematuria Neuro: no dizziness or headaches  all others reviewed and negative   Objective: Vitals:   07/28/17 0910  BP: 114/72  Pulse: (!) 103  Resp: 16  Temp: 99 F (37.2 C)  TempSrc: Oral  SpO2: 98%  Weight: 205 lb 12.8 oz (93.4 kg)  Height: 5\' 7"  (1.702 m)    Physical Exam  Constitutional: She is oriented to person, place, and time. She appears well-developed and well-nourished.  HENT:  Head: Normocephalic and atraumatic.  Eyes: Conjunctivae and EOM are normal.  Cardiovascular:  Normal rate, regular rhythm and normal heart sounds.  No murmur heard. Pulmonary/Chest: Effort normal. No stridor. No respiratory distress.  Musculoskeletal:       Right shoulder: She exhibits bony tenderness. She exhibits normal range of motion, no tenderness, no swelling, no effusion, no crepitus, no deformity, no laceration, no pain, no spasm, normal pulse and normal strength.       Left shoulder: She exhibits normal range of motion, no tenderness, no bony tenderness, no swelling, no effusion, no crepitus, no deformity, no laceration, no pain, no spasm, normal pulse and normal strength.       Right wrist: Normal.       Left wrist: Normal.       Cervical back: Normal. She exhibits normal range of motion, no tenderness, no bony tenderness, no swelling, no edema and no deformity.       Right hand: She exhibits normal range of motion, no tenderness, no bony tenderness, normal capillary refill, no deformity and no laceration.       Left hand: She exhibits normal range of motion, no tenderness, no bony tenderness, normal capillary refill, no deformity and no laceration.  Neurological: She is alert and oriented to person, place, and time.  Psychiatric: She has a normal mood and affect. Her behavior is normal. Judgment and thought content normal.   Normal exam of the scapula- no winged scapula Normal exam of the rotator cuff bilaterally On the right side there is reduced rom with external rotation Tenderness to palpation of the deltoid muscle No AC joint tenderness  Assessment and Plan Hitomi was seen today for new patient (initial visit).  Diagnoses and all orders for this visit:  Possible exposure to STD -     C. trachomatis/N. gonorrhoeae RNA -     HIV antibody -     HCV Ab w/Rflx to Verification -     Hepatitis B surface antibody -     RPR  Acute pain of right shoulder- no rotator cuff injury but there is some pain and limitation of movement  Will refer to PT -     Ambulatory  referral to Physical Therapy  Other orders -     meloxicam (MOBIC) 7.5 MG tablet; Take 1 tablet (7.5 mg total) by mouth daily.     [place]

## 2017-07-29 LAB — RPR: RPR: NONREACTIVE

## 2017-07-29 LAB — HCV AB W/RFLX TO VERIFICATION: HCV Ab: <0.1 s/co ratio (ref 0.0–0.9)

## 2017-07-29 LAB — HCV INTERPRETATION

## 2017-07-29 LAB — HEPATITIS B SURFACE ANTIBODY, QUANTITATIVE: Hepatitis B Surf Ab Quant: <3.1 m[IU]/mL — ABNORMAL LOW (ref >9.9)

## 2017-07-29 LAB — HIV ANTIBODY (ROUTINE TESTING W REFLEX): HIV SCREEN 4TH GENERATION: NONREACTIVE

## 2017-07-30 LAB — GC/CHLAMYDIA PROBE AMP
CHLAMYDIA, DNA PROBE: NEGATIVE
Neisseria gonorrhoeae by PCR: NEGATIVE

## 2020-04-19 ENCOUNTER — Other Ambulatory Visit (HOSPITAL_BASED_OUTPATIENT_CLINIC_OR_DEPARTMENT_OTHER): Payer: Self-pay | Admitting: Family Medicine

## 2020-04-19 DIAGNOSIS — Z1231 Encounter for screening mammogram for malignant neoplasm of breast: Secondary | ICD-10-CM

## 2020-05-02 ENCOUNTER — Ambulatory Visit (HOSPITAL_BASED_OUTPATIENT_CLINIC_OR_DEPARTMENT_OTHER): Payer: BLUE CROSS/BLUE SHIELD

## 2020-05-09 ENCOUNTER — Encounter (HOSPITAL_BASED_OUTPATIENT_CLINIC_OR_DEPARTMENT_OTHER): Payer: Self-pay

## 2020-05-09 ENCOUNTER — Ambulatory Visit (HOSPITAL_BASED_OUTPATIENT_CLINIC_OR_DEPARTMENT_OTHER)
Admission: RE | Admit: 2020-05-09 | Discharge: 2020-05-09 | Disposition: A | Discharge status: Home / Self Care | Payer: 59 | Source: Ambulatory Visit | Attending: Family Medicine | Admitting: Family Medicine

## 2020-05-09 ENCOUNTER — Other Ambulatory Visit: Payer: Self-pay

## 2020-05-09 DIAGNOSIS — Z1231 Encounter for screening mammogram for malignant neoplasm of breast: Secondary | ICD-10-CM | POA: Diagnosis not present
